# Patient Record
Sex: Male | Born: 1945
Health system: Southern US, Community
[De-identification: ages and names within clinical notes are randomized; demographics above are authoritative.]

## PROBLEM LIST (undated history)

## (undated) DIAGNOSIS — I10 Essential (primary) hypertension: Secondary | ICD-10-CM

## (undated) DIAGNOSIS — E119 Type 2 diabetes mellitus without complications: Secondary | ICD-10-CM

## (undated) DIAGNOSIS — J449 Chronic obstructive pulmonary disease, unspecified: Secondary | ICD-10-CM

## (undated) HISTORY — PX: EYE SURGERY: SHX253

## (undated) HISTORY — PX: HERNIA REPAIR: SHX51

## (undated) HISTORY — PX: KNEE CAPSULOTOMY: SHX994

## (undated) HISTORY — PX: APPENDECTOMY: SHX54

---

## 2017-01-10 ENCOUNTER — Ambulatory Visit (INDEPENDENT_AMBULATORY_CARE_PROVIDER_SITE_OTHER): Payer: PRIVATE HEALTH INSURANCE

## 2017-01-10 ENCOUNTER — Ambulatory Visit
Admission: EM | Admit: 2017-01-10 | Discharge: 2017-01-10 | Disposition: A | Discharge status: Home / Self Care | Payer: PRIVATE HEALTH INSURANCE | Attending: Family Medicine | Admitting: Family Medicine

## 2017-01-10 ENCOUNTER — Encounter: Payer: Self-pay | Admitting: *Deleted

## 2017-01-10 DIAGNOSIS — J441 Chronic obstructive pulmonary disease with (acute) exacerbation: Secondary | ICD-10-CM

## 2017-01-10 DIAGNOSIS — R05 Cough: Secondary | ICD-10-CM

## 2017-01-10 DIAGNOSIS — R059 Cough, unspecified: Secondary | ICD-10-CM

## 2017-01-10 DIAGNOSIS — J209 Acute bronchitis, unspecified: Secondary | ICD-10-CM

## 2017-01-10 DIAGNOSIS — J069 Acute upper respiratory infection, unspecified: Secondary | ICD-10-CM | POA: Diagnosis not present

## 2017-01-10 HISTORY — DX: Type 2 diabetes mellitus without complications: E11.9

## 2017-01-10 HISTORY — DX: Chronic obstructive pulmonary disease, unspecified: J44.9

## 2017-01-10 MED ORDER — IPRATROPIUM-ALBUTEROL 0.5-2.5 (3) MG/3ML IN SOLN
3.0000 mL | Freq: Once | RESPIRATORY_TRACT | Status: AC
  Administered 2017-01-10: 3 mL via RESPIRATORY_TRACT

## 2017-01-10 MED ORDER — METHYLPREDNISOLONE SODIUM SUCC 125 MG IJ SOLR
125.0000 mg | Freq: Once | INTRAMUSCULAR | Status: AC
  Administered 2017-01-10: 125 mg via INTRAMUSCULAR

## 2017-01-10 MED ORDER — AZITHROMYCIN 500 MG PO TABS: ORAL_TABLET | ORAL | 0 refills | Status: DC

## 2017-01-10 MED ORDER — FEXOFENADINE-PSEUDOEPHED ER 180-240 MG PO TB24: 1.0000 | ORAL_TABLET | Freq: Every day | ORAL | 0 refills | Status: DC

## 2017-01-10 MED ORDER — ALBUTEROL SULFATE HFA 108 (90 BASE) MCG/ACT IN AERS: 2.0000 | INHALATION_SPRAY | RESPIRATORY_TRACT | 0 refills | Status: DC | PRN

## 2017-01-10 MED ORDER — HYDROCOD POLST-CPM POLST ER 10-8 MG/5ML PO SUER: 5.0000 mL | Freq: Two times a day (BID) | ORAL | 0 refills | Status: DC | PRN

## 2017-01-10 MED ORDER — PREDNISONE 10 MG (21) PO TBPK: ORAL_TABLET | ORAL | 0 refills | Status: DC

## 2017-01-10 NOTE — ED Triage Notes (Signed)
Productive cough- yellow, runny nose, fever (103) this am, head and chest congestion, chills, body aches, x2 days.

## 2017-01-10 NOTE — ED Provider Notes (Signed)
[facility]    CSN: [id number] Arrival date & time: 01/10/17  1515     History   Chief Complaint Chief Complaint  Patient presents with  . Cough  . Nasal Congestion  . Fever  . Generalized Body Aches  . Chills  . Sore Throat    HPI Darren Sharp is a 71 y.o. male.   Patient is a 71 year old white male who has had a history of COPD in the past. He stopped smoking about 6 years ago for the [place] he was told his lungs doing much better and 8 with not going to call him having COPD. Apparently is not really had any trouble with his breathing until recently in the last month he's had numerous episodes of coughing and chest congestion and some shortness of breath. They thought that this was just a cold and was premarked weight and things chronically get better. However recently asked to 3 days to coughing has gotten worse. He's had more shortness of breath he'swheezing as well. His wife states that he was actually doing little bit better yesterday he went out riding on his motorcycle and came back in much worse short of breath coughing and congested. He does have trouble with allergies seasonal allergies. He does have history of diabetes and he's had a hernia repair performed the past. No pertinent family medical history relevant to today's visit.   The history is provided by the patient and the spouse.  Cough  Cough characteristics:  Productive and non-productive Sputum characteristics:  Yellow and green Severity:  Moderate Onset quality:  Sudden Timing:  Constant Progression:  Worsening Chronicity:  New Smoker: no   Context: upper respiratory infection, weather changes and with activity   Relieved by:  Nothing Worsened by:  Deep breathing Ineffective treatments:  None tried Associated symptoms: fever, shortness of breath and sinus congestion   Associated symptoms: no chest pain, no myalgias, no rash, no sore throat and no weight loss   Fever  Associated symptoms:  cough   Associated symptoms: no chest pain, no myalgias, no rash and no sore throat   Sore Throat  Associated symptoms include shortness of breath. Pertinent negatives include no chest pain.    Past Medical History:  Diagnosis Date  . COPD (chronic obstructive pulmonary disease) ([place])   . Diabetes mellitus without complication ([place])     There are no active problems to display for this patient.   Past Surgical History:  Procedure Laterality Date  . HERNIA REPAIR         Home Medications    Prior to Admission medications   Medication Sig Start Date End Date Taking? Authorizing Provider  atorvastatin (LIPITOR) 40 MG tablet Take 40 mg by mouth daily.   Yes Historical Provider, MD  lisinopril (PRINIVIL,ZESTRIL) 20 MG tablet Take 20 mg by mouth daily.   Yes Historical Provider, MD  albuterol (PROVENTIL HFA;VENTOLIN HFA) 108 (90 Base) MCG/ACT inhaler Inhale 2 puffs into the lungs every 4 (four) hours as needed for wheezing or shortness of breath. 01/10/17   Frederich Cha, MD  azithromycin (ZITHROMAX) 500 MG tablet 1 tablet daily for 5 days 01/10/17   Frederich Cha, MD  chlorpheniramine-HYDROcodone [facility] PENNKINETIC ER) 10-8 MG/5ML SUER Take 5 mLs by mouth every 12 (twelve) hours as needed for cough. 01/10/17   Frederich Cha, MD  fexofenadine-pseudoephedrine (ALLEGRA-D ALLERGY & CONGESTION) 180-240 MG 24 hr tablet Take 1 tablet by mouth daily. 01/10/17   Frederich Cha, MD  predniSONE [facility]  UNI-PAK 21 TAB) 10 MG (21) TBPK tablet Sig 6 tablet day 1, 5 tablets day 2, 4 tablets day 3,,3tablets day 4, 2 tablets day 5, 1 tablet day 6 take all tablets orally 01/10/17   Frederich Cha, MD    Family History History reviewed. No pertinent family history.  Social History Social History  Substance Use Topics  . Smoking status: Former Research scientist (life sciences)  . Smokeless tobacco: Never Used  . Alcohol use Yes     Allergies   Penicillins   Review of Systems Review of Systems  Constitutional: Positive for  fatigue and fever. Negative for weight loss.  HENT: Negative for sore throat.   Respiratory: Positive for cough and shortness of breath.   Cardiovascular: Negative for chest pain.  Musculoskeletal: Negative for myalgias.  Skin: Negative for rash.  All other systems reviewed and are negative.    Physical Exam Triage Vital Signs ED Triage Vitals  Enc Vitals Group     BP 01/10/17 1536 132/69     Pulse Rate 01/10/17 1536 86     Resp 01/10/17 1536 16     Temp 01/10/17 1536 99.3 F (37.4 C)     Temp Source 01/10/17 1536 Oral     SpO2 01/10/17 1536 97 %     Weight 01/10/17 1538 150 lb (68 kg)     Height 01/10/17 1538 5\' 8"  (1.727 m)     Head Circumference --      Peak Flow --      Pain Score --      Pain Loc --      Pain Edu? --      Excl. in [place]? --    No data found.   Updated Vital Signs BP 132/69 (BP Location: Left Arm)   Pulse 86   Temp 99.3 F (37.4 C) (Oral)   Resp 16   Ht 5\' 8"  (1.727 m)   Wt 150 lb (68 kg)   SpO2 97%   BMI 22.81 kg/m   Visual Acuity Right Eye Distance:   Left Eye Distance:   Bilateral Distance:    Right Eye Near:   Left Eye Near:    Bilateral Near:     Physical Exam  Constitutional: He is oriented to person, place, and time. He appears well-developed and well-nourished.  HENT:  Head: Normocephalic and atraumatic.  Right Ear: External ear normal.  Left Ear: External ear normal.  Mouth/Throat: Oropharynx is clear and moist.  Eyes: Conjunctivae and EOM are normal. Pupils are equal, round, and reactive to light.  Neck: Normal range of motion. Neck supple.  Cardiovascular: Normal rate, regular rhythm and normal heart sounds.   Pulmonary/Chest: He has decreased breath sounds. He has wheezes.  Musculoskeletal: Normal range of motion. He exhibits no edema or deformity.  Lymphadenopathy:    He has cervical adenopathy.  Neurological: He is alert and oriented to person, place, and time.  Skin: Skin is warm.  Psychiatric: He has a normal mood  and affect.  Vitals reviewed.    UC Treatments / Results  Labs (all labs ordered are listed, but only abnormal results are displayed) Labs Reviewed - No data to display  EKG  EKG Interpretation None       Radiology Dg Chest 2 View  Result Date: 01/10/2017 CLINICAL DATA:  Cough for 4 weeks, some shortness of breath and body aches EXAM: CHEST  2 VIEW COMPARISON:  None. FINDINGS: No pneumonia or effusion is seen. The lungs are slightly hyperaerated. Probable linear  scarring is noted in the right mid lung. Mediastinal and hilar contours are unremarkable. There is some peribronchial thickening which may indicate bronchitis. The heart is within normal limits in size. No bony abnormality is seen. There is evidence of thoracoabdominal aortic atherosclerosis present. IMPRESSION: 1. Hyper aeration.  No pneumonia. 2. Question bronchitis. 3. Thoracolumbar aortic atherosclerosis. Electronically Signed   By: Ivar Drape M.D.   On: 01/10/2017 17:10    Procedures Procedures (including critical care time)  Medications Ordered in UC Medications  methylPREDNISolone sodium succinate (SOLU-MEDROL) 125 mg/2 mL injection 125 mg (125 mg Intramuscular Given 01/10/17 1646)  ipratropium-albuterol (DUONEB) 0.5-2.5 (3) MG/3ML nebulizer solution 3 mL (3 mLs Nebulization Given 01/10/17 1648)     Initial Impression / Assessment and Plan / UC Course  I have reviewed the triage vital signs and the nursing notes.  Pertinent labs & imaging results that were available during my care of the patient were reviewed by me and considered in my medical decision making (see chart for details).     Several different things going on in one continuous cough 3-4 weeks will make sure he does not have pneumonia we'll get a chest x-ray. He's had a history of COPD and while he's not had really any exacerbations in years since he stopped smoking distal carcinoma stammers and ventricles lungs explained to him and his wife this is  made he was seeing now is gotten older. The other question is whether some of this is seasonal allergies prednisone will help with his COPD or seasonal allergies. We will going to give him a breathing treatment here now see if it helps with DuoNeb and also going to give him 125 mg of Solu-Medrol IM. We'll place him on a 6 day course penicillin taper Tussionex 1 teaspoon twice a day Allegra-D for the current nasal congestion and the antibiotic will be there is Zithromax 500 for 5 days if chest x-ray is negative on Levaquin 500 mg for 10 days if chest x-ray is positive. Follow-up PCP is needed and we'll give no for today and tomorrow as well.      Final Clinical Impressions(s) / UC Diagnoses   Final diagnoses:  Upper respiratory tract infection, unspecified type  Cough  Acute bronchitis with bronchospasm  COPD exacerbation (HCC)    New Prescriptions New Prescriptions   ALBUTEROL (PROVENTIL HFA;VENTOLIN HFA) 108 (90 BASE) MCG/ACT INHALER    Inhale 2 puffs into the lungs every 4 (four) hours as needed for wheezing or shortness of breath.   AZITHROMYCIN (ZITHROMAX) 500 MG TABLET    1 tablet daily for 5 days   CHLORPHENIRAMINE-HYDROCODONE (TUSSIONEX PENNKINETIC ER) 10-8 MG/5ML SUER    Take 5 mLs by mouth every 12 (twelve) hours as needed for cough.   FEXOFENADINE-PSEUDOEPHEDRINE (ALLEGRA-D ALLERGY & CONGESTION) 180-240 MG 24 HR TABLET    Take 1 tablet by mouth daily.   PREDNISONE (STERAPRED UNI-PAK 21 TAB) 10 MG (21) TBPK TABLET    Sig 6 tablet day 1, 5 tablets day 2, 4 tablets day 3,,3tablets day 4, 2 tablets day 5, 1 tablet day 6 take all tablets orally    Note: This dictation was prepared with Dragon dictation along with smaller phrase technology. Any transcriptional errors that result from this process are unintentional.   Frederich Cha, MD 01/10/17 1730

## 2017-01-21 ENCOUNTER — Ambulatory Visit: Payer: Self-pay | Admitting: Family Medicine

## 2017-01-28 ENCOUNTER — Ambulatory Visit (INDEPENDENT_AMBULATORY_CARE_PROVIDER_SITE_OTHER): Payer: PPO | Admitting: Family Medicine

## 2017-01-28 ENCOUNTER — Encounter: Payer: Self-pay | Admitting: Family Medicine

## 2017-01-28 VITALS — BP 138/82 | HR 79 | Resp 16 | Ht 68.0 in | Wt 158.0 lb

## 2017-01-28 DIAGNOSIS — I700 Atherosclerosis of aorta: Secondary | ICD-10-CM

## 2017-01-28 DIAGNOSIS — Z8709 Personal history of other diseases of the respiratory system: Secondary | ICD-10-CM

## 2017-01-28 DIAGNOSIS — E785 Hyperlipidemia, unspecified: Secondary | ICD-10-CM | POA: Insufficient documentation

## 2017-01-28 DIAGNOSIS — J40 Bronchitis, not specified as acute or chronic: Secondary | ICD-10-CM | POA: Diagnosis not present

## 2017-01-28 DIAGNOSIS — Z8669 Personal history of other diseases of the nervous system and sense organs: Secondary | ICD-10-CM | POA: Insufficient documentation

## 2017-01-28 DIAGNOSIS — I10 Essential (primary) hypertension: Secondary | ICD-10-CM | POA: Insufficient documentation

## 2017-01-28 DIAGNOSIS — Z8639 Personal history of other endocrine, nutritional and metabolic disease: Secondary | ICD-10-CM

## 2017-01-28 MED ORDER — LISINOPRIL 20 MG PO TABS: 20.0000 mg | ORAL_TABLET | Freq: Every day | ORAL | 3 refills | Status: DC

## 2017-01-30 NOTE — Progress Notes (Signed)
Date:  01/28/2017   Name:  Darren Sharp   DOB:  28-May-1946   MRN:  [id number]  PCP:  Adline Potter, MD    Chief Complaint: Establish Care   History of Present Illness:  This is a 71 y.o. male seen for initial visit. Seen [facility] 3 wks ago for bronchitis, placed on azith and steroids, improved but still has cough prod yellow/green phlegm and rhinorrhea, albuterol helps cough. CXR showed aortic atherosclerosis. Hx "stage 3" COPD which resolved when stopped smoking., does not usually wheeze or cough. HTN on lisinopril for yrs with good control. HLD on Lipitor x 3-4 yrs, last lipids 6 months ago. Takes ibuprofen prn for intermittent neck pain about once weekly. Hx T2DM in past but resolved with weight loss, not checking sugars currently. Nocturia x 1 stable. Hx B detached retina, sees optho regularly. Father died mesothelioma, mother died pulmonary fibrosis. Tet imm 3 yrs ago, Prevnar last month, declines zoster imm.  Review of Systems:  Review of Systems  Constitutional: Negative for chills and fever.  HENT: Negative for ear pain and sore throat.   Eyes: Negative for pain.  Respiratory: Negative for shortness of breath.   Cardiovascular: Negative for chest pain and leg swelling.  Gastrointestinal: Negative for abdominal pain.  Endocrine: Negative for polydipsia and polyuria.  Musculoskeletal: Negative for myalgias.  Neurological: Negative for syncope and light-headedness.    Patient Active Problem List   Diagnosis Date Noted  . Hypertension 01/28/2017  . Hyperlipidemia 01/28/2017  . Bronchitis 01/28/2017  . History of COPD 01/28/2017  . Hx of retinal detachment 01/28/2017  . Hx of diabetes mellitus 01/28/2017    Prior to Admission medications   Medication Sig Start Date End Date Taking? Authorizing Provider  albuterol (PROVENTIL HFA;VENTOLIN HFA) 108 (90 Base) MCG/ACT inhaler Inhale 2 puffs into the lungs every 4 (four) hours as needed for wheezing or shortness of breath. 01/10/17  Yes  Frederich Cha, MD  atorvastatin (LIPITOR) 40 MG tablet Take 40 mg by mouth daily.   Yes Historical Provider, MD  lisinopril (PRINIVIL,ZESTRIL) 20 MG tablet Take 1 tablet (20 mg total) by mouth daily. 01/28/17  Yes Adline Potter, MD    Allergies  Allergen Reactions  . Penicillins Swelling    Past Surgical History:  Procedure Laterality Date  . APPENDECTOMY    . EYE SURGERY    . HERNIA REPAIR    . KNEE CAPSULOTOMY Right     Social History  Substance Use Topics  . Smoking status: Former Smoker    Types: Cigarettes    Quit date: 01/28/2010  . Smokeless tobacco: Never Used  . Alcohol use Yes    Family History  Problem Relation Age of Onset  . Cancer Mother     lung  . Cancer Father     lung cancer     Medication list has been reviewed and updated.  Physical Examination: BP 138/82   Pulse 79   Resp 16   Ht 5\' 8"  (1.727 m)   Wt 158 lb (71.7 kg)   SpO2 99%   BMI 24.02 kg/m   Physical Exam  Constitutional: He is oriented to person, place, and time. He appears well-developed and well-nourished.  HENT:  Head: Normocephalic and atraumatic.  Right Ear: External ear normal.  Left Ear: External ear normal.  Nose: Nose normal.  Mouth/Throat: Oropharynx is clear and moist.  TMs clear  Eyes: Conjunctivae and EOM are normal. Pupils are equal, round, and reactive to light. No scleral  icterus.  Neck: Neck supple. No thyromegaly present.  Cardiovascular: Normal rate, regular rhythm, normal heart sounds and intact distal pulses.   Pulmonary/Chest: Effort normal and breath sounds normal. He has no wheezes. He has no rales.  Abdominal: Soft. He exhibits no distension and no mass. There is no tenderness.  Musculoskeletal: He exhibits no edema.  Lymphadenopathy:    He has no cervical adenopathy.  Neurological: He is alert and oriented to person, place, and time. Coordination normal.  Romberg negative, gait normal  Skin: Skin is warm and dry.  Psychiatric: He has a normal mood and  affect. His behavior is normal.  Nursing note and vitals reviewed.   Assessment and Plan:  1. Bronchitis Resolving, cont albuterol MDI prn, consider pulm referral if sxs persist given hx COPD  2. Aortic atherosclerosis ([place]) On CXR, asymptomatic, cont statin, consider asa  3. Essential hypertension Marginal control on lisinopril, recheck in one month - Comprehensive Metabolic Panel (CMET) - CBC  4. Hyperlipidemia, unspecified hyperlipidemia type Unclear control on Lipitor - Lipid Profile  5. History of COPD  6. Hx of retinal detachment  7. Hx of diabetes mellitus - HgB A1c  8. HM Will need Pneumovax in one year  Return in about 4 weeks (around 02/25/2017).   45 minutes spent with pt over half in counseling  Tabytha Gradillas M. [facility]  01/30/2017

## 2017-02-04 DIAGNOSIS — Z8639 Personal history of other endocrine, nutritional and metabolic disease: Secondary | ICD-10-CM | POA: Diagnosis not present

## 2017-02-04 DIAGNOSIS — E785 Hyperlipidemia, unspecified: Secondary | ICD-10-CM | POA: Diagnosis not present

## 2017-02-04 DIAGNOSIS — I10 Essential (primary) hypertension: Secondary | ICD-10-CM | POA: Diagnosis not present

## 2017-02-05 ENCOUNTER — Other Ambulatory Visit: Payer: Self-pay | Admitting: Family Medicine

## 2017-02-05 LAB — COMPREHENSIVE METABOLIC PANEL
ALT: 19 IU/L (ref 0–44)
AST: 16 IU/L (ref 0–40)
Albumin/Globulin Ratio: 1.9 (ref 1.2–2.2)
Albumin: 4.2 g/dL (ref 3.6–4.8)
Alkaline Phosphatase: 55 IU/L (ref 39–117)
BUN/Creatinine Ratio: 22 (ref 10–24)
BUN: 22 mg/dL (ref 8–27)
Bilirubin Total: 0.5 mg/dL (ref 0.0–1.2)
CO2: 22 mmol/L (ref 18–29)
Calcium: 9.1 mg/dL (ref 8.6–10.2)
Chloride: 103 mmol/L (ref 96–106)
Creatinine, Ser: 0.98 mg/dL (ref 0.76–1.27)
GFR calc Af Amer: 91 mL/min/{1.73_m2} (ref >59)
GFR calc non Af Amer: 78 mL/min/{1.73_m2} (ref >59)
Globulin, Total: 2.2 g/dL (ref 1.5–4.5)
Glucose: 132 mg/dL — ABNORMAL HIGH (ref 65–99)
Potassium: 4.6 mmol/L (ref 3.5–5.2)
Sodium: 143 mmol/L (ref 134–144)
Total Protein: 6.4 g/dL (ref 6.0–8.5)

## 2017-02-05 LAB — CBC
Hematocrit: 38.9 % (ref 37.5–51.0)
Hemoglobin: 13.1 g/dL (ref 13.0–17.7)
MCH: 31 pg (ref 26.6–33.0)
MCHC: 33.7 g/dL (ref 31.5–35.7)
MCV: 92 fL (ref 79–97)
Platelets: 253 10*3/uL (ref 150–379)
RBC: 4.22 x10E6/uL (ref 4.14–5.80)
RDW: 13.5 % (ref 12.3–15.4)
WBC: 5.7 10*3/uL (ref 3.4–10.8)

## 2017-02-05 LAB — LIPID PANEL
Chol/HDL Ratio: 3.2 ratio units (ref 0.0–5.0)
Cholesterol, Total: 151 mg/dL (ref 100–199)
HDL: 47 mg/dL (ref >39)
LDL Calculated: 91 mg/dL (ref 0–99)
Triglycerides: 64 mg/dL (ref 0–149)
VLDL Cholesterol Cal: 13 mg/dL (ref 5–40)

## 2017-02-05 LAB — HEMOGLOBIN A1C
Est. average glucose Bld gHb Est-mCnc: 160 mg/dL
Hgb A1c MFr Bld: 7.2 % — ABNORMAL HIGH (ref 4.8–5.6)

## 2017-02-05 MED ORDER — ASPIRIN 81 MG PO TABS: 81.0000 mg | ORAL_TABLET | Freq: Every day | ORAL | Status: AC

## 2017-02-05 NOTE — Progress Notes (Signed)
a 

## 2017-03-04 ENCOUNTER — Ambulatory Visit: Payer: PPO | Admitting: Family Medicine

## 2017-03-15 DIAGNOSIS — E119 Type 2 diabetes mellitus without complications: Secondary | ICD-10-CM | POA: Diagnosis not present

## 2017-09-20 DIAGNOSIS — R0602 Shortness of breath: Secondary | ICD-10-CM | POA: Diagnosis not present

## 2017-09-20 DIAGNOSIS — R05 Cough: Secondary | ICD-10-CM | POA: Diagnosis not present

## 2017-09-26 ENCOUNTER — Telehealth: Payer: Self-pay | Admitting: Family Medicine

## 2017-09-26 ENCOUNTER — Ambulatory Visit: Payer: PPO | Admitting: Family Medicine

## 2017-09-26 ENCOUNTER — Encounter: Payer: Self-pay | Admitting: Family Medicine

## 2017-09-26 VITALS — BP 138/70 | HR 80 | Ht 68.0 in | Wt 168.0 lb

## 2017-09-26 DIAGNOSIS — Z8709 Personal history of other diseases of the respiratory system: Secondary | ICD-10-CM | POA: Diagnosis not present

## 2017-09-26 DIAGNOSIS — J40 Bronchitis, not specified as acute or chronic: Secondary | ICD-10-CM | POA: Diagnosis not present

## 2017-09-26 MED ORDER — GUAIFENESIN-CODEINE 100-10 MG/5ML PO SYRP: 5.0000 mL | ORAL_SOLUTION | Freq: Three times a day (TID) | ORAL | 0 refills | Status: DC | PRN

## 2017-09-26 MED ORDER — ALBUTEROL SULFATE HFA 108 (90 BASE) MCG/ACT IN AERS: 2.0000 | INHALATION_SPRAY | RESPIRATORY_TRACT | 0 refills | Status: DC | PRN

## 2017-09-26 MED ORDER — LEVOFLOXACIN 500 MG PO TABS: 500.0000 mg | ORAL_TABLET | Freq: Every day | ORAL | 0 refills | Status: DC

## 2017-09-26 NOTE — Telephone Encounter (Signed)
Pts wife calling about pt not feeling better after being seen at an urgent care in [place] while they were out, a lot of coughing and secretions. Told pt you will call her.

## 2017-09-26 NOTE — Telephone Encounter (Signed)
Called Baxter Flattery and she said it is ok to have them see Darren Sharp today. They have notes from [place] and Xray results. Advised to ONLY deal with cough today. Coming in at 1:30 PM. Thanks

## 2017-09-26 NOTE — Progress Notes (Signed)
Name: Darren Sharp   MRN: [id number]    DOB: 20-May-1946   Date:09/26/2017       Progress Note  Subjective  Chief Complaint  Chief Complaint  Patient presents with  . Follow-up    was in [place]- woke up in the middle of the night with bronchial spasms, thick mucus and "terrible cough". Returned home x 2 days ago. Started with the cough and yellow production again last night. Along with nasal drainage.    Cough  This is a new problem. The current episode started in the past 7 days. The problem has been waxing and waning. The cough is productive of purulent sputum. Associated symptoms include wheezing. Pertinent negatives include no chest pain, chills, ear congestion, ear pain, fever, headaches, heartburn, hemoptysis, myalgias, nasal congestion, postnasal drip, rash, rhinorrhea, sore throat, shortness of breath, sweats or weight loss. He has tried a beta-agonist inhaler and prescription cough suppressant for the symptoms. The treatment provided moderate relief. His past medical history is significant for COPD and emphysema. There is no history of asthma, bronchiectasis, bronchitis, environmental allergies or pneumonia.    No problem-specific Assessment & Plan notes found for this encounter.   Past Medical History:  Diagnosis Date  . COPD (chronic obstructive pulmonary disease) ([place])   . COPD (chronic obstructive pulmonary disease) ([place])   . Diabetes mellitus without complication [facility])     Past Surgical History:  Procedure Laterality Date  . APPENDECTOMY    . EYE SURGERY    . HERNIA REPAIR    . KNEE CAPSULOTOMY Right     Family History  Problem Relation Age of Onset  . Cancer Mother        lung  . Cancer Father        lung cancer     Social History   Socioeconomic History  . Marital status: Married    Spouse name: Not on file  . Number of children: Not on file  . Years of education: Not on file  . Highest education level: Not on file  Social Needs  . Financial resource  strain: Not on file  . Food insecurity - worry: Not on file  . Food insecurity - inability: Not on file  . Transportation needs - medical: Not on file  . Transportation needs - non-medical: Not on file  Occupational History  . Not on file  Tobacco Use  . Smoking status: Former Smoker    Types: Cigarettes    Last attempt to quit: 01/28/2010    Years since quitting: 7.6  . Smokeless tobacco: Never Used  Substance and Sexual Activity  . Alcohol use: Yes  . Drug use: No  . Sexual activity: Not on file  Other Topics Concern  . Not on file  Social History Narrative  . Not on file    Allergies  Allergen Reactions  . Penicillins Swelling    Outpatient Medications Prior to Visit  Medication Sig Dispense Refill  . aspirin 81 MG tablet Take 1 tablet (81 mg total) by mouth daily.    [name] atorvastatin (LIPITOR) 40 MG tablet Take 40 mg by mouth daily.    [name] lisinopril (PRINIVIL,ZESTRIL) 20 MG tablet Take 1 tablet (20 mg total) by mouth daily. 90 tablet 3  . albuterol (PROVENTIL HFA;VENTOLIN HFA) 108 (90 Base) MCG/ACT inhaler Inhale 2 puffs into the lungs every 4 (four) hours as needed for wheezing or shortness of breath. 1 Inhaler 0   No facility-administered medications prior to visit.  Review of Systems  Constitutional: Negative for chills, fever, malaise/fatigue and weight loss.  HENT: Negative for ear discharge, ear pain, postnasal drip, rhinorrhea and sore throat.   Eyes: Negative for blurred vision.  Respiratory: Positive for cough and wheezing. Negative for hemoptysis, sputum production and shortness of breath.   Cardiovascular: Negative for chest pain, palpitations and leg swelling.  Gastrointestinal: Negative for abdominal pain, blood in stool, constipation, diarrhea, heartburn, melena and nausea.  Genitourinary: Negative for dysuria, frequency, hematuria and urgency.  Musculoskeletal: Negative for back pain, joint pain, myalgias and neck pain.  Skin: Negative for rash.   Neurological: Negative for dizziness, tingling, sensory change, focal weakness and headaches.  Endo/Heme/Allergies: Negative for environmental allergies and polydipsia. Does not bruise/bleed easily.  Psychiatric/Behavioral: Negative for depression and suicidal ideas. The patient is not nervous/anxious and does not have insomnia.      Objective  Vitals:   09/26/17 1335  BP: 138/70  Pulse: 80  SpO2: 97%  Weight: 168 lb (76.2 kg)  Height: 5\' 8"  (1.727 m)    Physical Exam  Constitutional: He is oriented to person, place, and time and well-developed, well-nourished, and in no distress.  HENT:  Head: Normocephalic.  Right Ear: External ear normal.  Left Ear: External ear normal.  Nose: Nose normal.  Mouth/Throat: Oropharynx is clear and moist.  Eyes: Conjunctivae and EOM are normal. Pupils are equal, round, and reactive to light. Right eye exhibits no discharge. Left eye exhibits no discharge. No scleral icterus.  Neck: Normal range of motion. Neck supple. No JVD present. No tracheal deviation present. No thyromegaly present.  Cardiovascular: Normal rate, regular rhythm, normal heart sounds and intact distal pulses. Exam reveals no gallop and no friction rub.  No murmur heard. Pulmonary/Chest: Breath sounds normal. No respiratory distress. He has no wheezes. He has no rales.  Abdominal: Soft. Bowel sounds are normal. He exhibits no mass. There is no hepatosplenomegaly. There is no tenderness. There is no rebound, no guarding and no CVA tenderness.  Musculoskeletal: Normal range of motion. He exhibits no edema or tenderness.  Lymphadenopathy:    He has no cervical adenopathy.  Neurological: He is alert and oriented to person, place, and time. He has normal sensation, normal strength, normal reflexes and intact cranial nerves. No cranial nerve deficit.  Skin: Skin is warm. No rash noted.  Psychiatric: Mood and affect normal.  Nursing note and vitals reviewed.     Assessment &  Plan  Problem List Items Addressed This Visit      Respiratory   Bronchitis - Primary   Relevant Medications   levofloxacin (LEVAQUIN) 500 MG tablet   guaiFENesin-codeine (ROBITUSSIN AC) 100-10 MG/5ML syrup   albuterol (PROVENTIL HFA;VENTOLIN HFA) 108 (90 Base) MCG/ACT inhaler     Other   History of COPD      Meds ordered this encounter  Medications  . levofloxacin (LEVAQUIN) 500 MG tablet    Sig: Take 1 tablet (500 mg total) daily by mouth.    Dispense:  7 tablet    Refill:  0  . guaiFENesin-codeine (ROBITUSSIN AC) 100-10 MG/5ML syrup    Sig: Take 5 mLs 3 (three) times daily as needed by mouth for cough.    Dispense:  100 mL    Refill:  0  . albuterol (PROVENTIL HFA;VENTOLIN HFA) 108 (90 Base) MCG/ACT inhaler    Sig: Inhale 2 puffs every 4 (four) hours as needed into the lungs for wheezing or shortness of breath.    Dispense:  1  Inhaler    Refill:  0      Dr. Deanna Jones [facility]  09/26/17

## 2017-10-18 DIAGNOSIS — I639 Cerebral infarction, unspecified: Secondary | ICD-10-CM | POA: Diagnosis not present

## 2017-10-18 DIAGNOSIS — E119 Type 2 diabetes mellitus without complications: Secondary | ICD-10-CM | POA: Diagnosis not present

## 2017-10-18 DIAGNOSIS — H3582 Retinal ischemia: Secondary | ICD-10-CM | POA: Diagnosis not present

## 2017-10-18 DIAGNOSIS — H35033 Hypertensive retinopathy, bilateral: Secondary | ICD-10-CM | POA: Diagnosis not present

## 2017-10-18 DIAGNOSIS — E785 Hyperlipidemia, unspecified: Secondary | ICD-10-CM | POA: Diagnosis not present

## 2017-10-18 DIAGNOSIS — Z8669 Personal history of other diseases of the nervous system and sense organs: Secondary | ICD-10-CM | POA: Diagnosis not present

## 2017-10-18 DIAGNOSIS — Z9889 Other specified postprocedural states: Secondary | ICD-10-CM | POA: Diagnosis not present

## 2017-10-18 DIAGNOSIS — H547 Unspecified visual loss: Secondary | ICD-10-CM | POA: Diagnosis not present

## 2017-10-18 DIAGNOSIS — H538 Other visual disturbances: Secondary | ICD-10-CM | POA: Diagnosis not present

## 2017-10-18 DIAGNOSIS — Z79899 Other long term (current) drug therapy: Secondary | ICD-10-CM | POA: Diagnosis not present

## 2017-10-18 DIAGNOSIS — H539 Unspecified visual disturbance: Secondary | ICD-10-CM | POA: Diagnosis not present

## 2017-10-18 DIAGNOSIS — I498 Other specified cardiac arrhythmias: Secondary | ICD-10-CM | POA: Diagnosis not present

## 2017-10-18 DIAGNOSIS — H35373 Puckering of macula, bilateral: Secondary | ICD-10-CM | POA: Diagnosis not present

## 2017-10-18 DIAGNOSIS — J449 Chronic obstructive pulmonary disease, unspecified: Secondary | ICD-10-CM | POA: Diagnosis not present

## 2017-10-18 DIAGNOSIS — G453 Amaurosis fugax: Secondary | ICD-10-CM | POA: Diagnosis not present

## 2017-10-18 DIAGNOSIS — I10 Essential (primary) hypertension: Secondary | ICD-10-CM | POA: Diagnosis not present

## 2017-10-18 DIAGNOSIS — G43809 Other migraine, not intractable, without status migrainosus: Secondary | ICD-10-CM | POA: Diagnosis not present

## 2017-11-18 DIAGNOSIS — Z1329 Encounter for screening for other suspected endocrine disorder: Secondary | ICD-10-CM | POA: Diagnosis not present

## 2017-11-18 DIAGNOSIS — E118 Type 2 diabetes mellitus with unspecified complications: Secondary | ICD-10-CM | POA: Diagnosis not present

## 2017-11-18 DIAGNOSIS — E785 Hyperlipidemia, unspecified: Secondary | ICD-10-CM | POA: Diagnosis not present

## 2017-11-18 DIAGNOSIS — Z125 Encounter for screening for malignant neoplasm of prostate: Secondary | ICD-10-CM | POA: Diagnosis not present

## 2017-11-18 DIAGNOSIS — Z1211 Encounter for screening for malignant neoplasm of colon: Secondary | ICD-10-CM | POA: Diagnosis not present

## 2017-11-18 DIAGNOSIS — I10 Essential (primary) hypertension: Secondary | ICD-10-CM | POA: Diagnosis not present

## 2017-11-25 DIAGNOSIS — H35033 Hypertensive retinopathy, bilateral: Secondary | ICD-10-CM | POA: Diagnosis not present

## 2017-11-25 DIAGNOSIS — H3582 Retinal ischemia: Secondary | ICD-10-CM | POA: Diagnosis not present

## 2017-11-25 DIAGNOSIS — H35373 Puckering of macula, bilateral: Secondary | ICD-10-CM | POA: Diagnosis not present

## 2017-11-25 DIAGNOSIS — G453 Amaurosis fugax: Secondary | ICD-10-CM | POA: Diagnosis not present

## 2018-02-10 DIAGNOSIS — E118 Type 2 diabetes mellitus with unspecified complications: Secondary | ICD-10-CM | POA: Diagnosis not present

## 2018-02-17 DIAGNOSIS — J438 Other emphysema: Secondary | ICD-10-CM | POA: Diagnosis not present

## 2018-02-17 DIAGNOSIS — I10 Essential (primary) hypertension: Secondary | ICD-10-CM | POA: Diagnosis not present

## 2018-02-17 DIAGNOSIS — E785 Hyperlipidemia, unspecified: Secondary | ICD-10-CM | POA: Diagnosis not present

## 2018-02-17 DIAGNOSIS — E118 Type 2 diabetes mellitus with unspecified complications: Secondary | ICD-10-CM | POA: Diagnosis not present

## 2018-02-17 DIAGNOSIS — Z0000 Encounter for general adult medical examination without abnormal findings: Secondary | ICD-10-CM | POA: Diagnosis not present

## 2018-03-03 DIAGNOSIS — Z1211 Encounter for screening for malignant neoplasm of colon: Secondary | ICD-10-CM | POA: Diagnosis not present

## 2018-03-03 DIAGNOSIS — E119 Type 2 diabetes mellitus without complications: Secondary | ICD-10-CM | POA: Diagnosis not present

## 2018-03-31 ENCOUNTER — Other Ambulatory Visit: Payer: Self-pay | Admitting: Family Medicine

## 2018-03-31 DIAGNOSIS — J40 Bronchitis, not specified as acute or chronic: Secondary | ICD-10-CM

## 2018-04-24 ENCOUNTER — Other Ambulatory Visit: Payer: Self-pay | Admitting: Family Medicine

## 2018-04-24 ENCOUNTER — Other Ambulatory Visit: Payer: Self-pay | Admitting: Internal Medicine

## 2018-04-29 DIAGNOSIS — R1314 Dysphagia, pharyngoesophageal phase: Secondary | ICD-10-CM | POA: Diagnosis not present

## 2018-04-29 DIAGNOSIS — K219 Gastro-esophageal reflux disease without esophagitis: Secondary | ICD-10-CM | POA: Diagnosis not present

## 2018-04-30 ENCOUNTER — Other Ambulatory Visit: Payer: Self-pay | Admitting: Otolaryngology

## 2018-04-30 ENCOUNTER — Other Ambulatory Visit: Payer: Self-pay | Admitting: Unknown Physician Specialty

## 2018-04-30 DIAGNOSIS — R1310 Dysphagia, unspecified: Secondary | ICD-10-CM

## 2018-05-01 ENCOUNTER — Other Ambulatory Visit: Payer: Self-pay | Admitting: Gastroenterology

## 2018-05-01 DIAGNOSIS — R1310 Dysphagia, unspecified: Secondary | ICD-10-CM

## 2018-05-05 ENCOUNTER — Ambulatory Visit: Admission: RE | Admit: 2018-05-05 | Payer: PPO | Source: Ambulatory Visit | Admitting: Gastroenterology

## 2018-05-05 ENCOUNTER — Encounter: Admission: RE | Payer: Self-pay | Source: Ambulatory Visit

## 2018-05-05 SURGERY — COLONOSCOPY WITH PROPOFOL
Anesthesia: General

## 2018-05-07 ENCOUNTER — Ambulatory Visit
Admission: RE | Admit: 2018-05-07 | Discharge: 2018-05-07 | Disposition: A | Discharge status: Home / Self Care | Payer: PPO | Source: Ambulatory Visit | Attending: Gastroenterology | Admitting: Gastroenterology

## 2018-05-07 DIAGNOSIS — T17308A Unspecified foreign body in larynx causing other injury, initial encounter: Secondary | ICD-10-CM | POA: Diagnosis not present

## 2018-05-07 DIAGNOSIS — R1310 Dysphagia, unspecified: Secondary | ICD-10-CM | POA: Diagnosis not present

## 2018-05-07 DIAGNOSIS — K449 Diaphragmatic hernia without obstruction or gangrene: Secondary | ICD-10-CM | POA: Insufficient documentation

## 2018-05-12 DIAGNOSIS — R1310 Dysphagia, unspecified: Secondary | ICD-10-CM | POA: Diagnosis not present

## 2018-05-12 DIAGNOSIS — E785 Hyperlipidemia, unspecified: Secondary | ICD-10-CM | POA: Diagnosis not present

## 2018-05-12 DIAGNOSIS — E118 Type 2 diabetes mellitus with unspecified complications: Secondary | ICD-10-CM | POA: Diagnosis not present

## 2018-05-12 DIAGNOSIS — J438 Other emphysema: Secondary | ICD-10-CM | POA: Diagnosis not present

## 2018-05-12 DIAGNOSIS — R531 Weakness: Secondary | ICD-10-CM | POA: Diagnosis not present

## 2018-05-12 DIAGNOSIS — I10 Essential (primary) hypertension: Secondary | ICD-10-CM | POA: Diagnosis not present

## 2018-05-27 ENCOUNTER — Ambulatory Visit
Admission: RE | Admit: 2018-05-27 | Discharge: 2018-05-27 | Disposition: A | Discharge status: Home / Self Care | Payer: PPO | Source: Ambulatory Visit | Attending: Otolaryngology | Admitting: Otolaryngology

## 2018-05-27 DIAGNOSIS — R1310 Dysphagia, unspecified: Secondary | ICD-10-CM | POA: Diagnosis not present

## 2018-05-27 NOTE — Therapy (Signed)
[place], [place], [address] Phone: [phone]   Fax:     Modified Barium Swallow  Patient Details  Name: Darren Sharp MRN: [id number] Date of Birth: 11-17-1946 No data recorded  Encounter Date: 05/27/2018  End of Session - 05/27/18 1324    Visit Number  1    Number of Visits  1    Date for SLP Re-Evaluation  05/27/18    SLP Start Time  1245    SLP Stop Time   1310    SLP Time Calculation (min)  25 min    Activity Tolerance  Patient tolerated treatment well       Past Medical History:  Diagnosis Date  . COPD (chronic obstructive pulmonary disease) ([place])   . COPD (chronic obstructive pulmonary disease) ([place])   . Diabetes mellitus without complication [facility])     Past Surgical History:  Procedure Laterality Date  . APPENDECTOMY    . EYE SURGERY    . HERNIA REPAIR    . KNEE CAPSULOTOMY Right     There were no vitals filed for this visit.  Subjective Assessment - 05/27/18 1403    Currently in Pain?  No/denies          General - 05/27/18 1402      General Information   Date of Onset  05/27/18    HPI  See felow information    Type of Study  MBS-Modified Barium Swallow Study    Diet Prior to this Study  Regular;Thin liquids    Temperature Spikes Noted  N/A    Respiratory Status  Room air    History of Recent Intubation  No        Subjective: Pt was pleasant and cooperative with evaluation. Pt reports that he had surgery on his epiglottis about 20 years ago because he could not swallow. Patient behavior: (alertness, ability to follow instructions, etc.): Pt was alert and cooperative with evaluation. He was able to follow all commands well.  Chief complaint: Pt reports that he has been getting choked on popcorn, fritos, chips and corn. He denies any difficulty with liquids. Pt is currently being followed by ENT and GI. Per ENT notes pt has Laryngopharyngeal reflux. Pt also recently  had a barium swallow completed which revealed one episode of laryngeal penetration of the barium with clearing as well as a non reducible hiatal hernia.    Objective:  Radiological Procedure: A videoflouroscopic evaluation of oral-preparatory, reflex initiation, and pharyngeal phases of the swallow was performed; as well as a screening of the upper esophageal phase.  I. POSTURE:  Pt positioned fully upright in flouro chair. II. VIEW: Lateral III. COMPENSATORY STRATEGIES: Chin tuck, small sips, follow up swallow, liquid wash IV. BOLUSES ADMINISTERED:  Thin Liquid: 5 cup sips  Nectar-thick Liquid: 2 cup sips  Honey-thick Liquid: Not tested  Puree: 2 tsps of puree  Solid: 1 bite of cracker V. RESULTS OF EVALUATION: A. ORAL PREPARATORY PHASE: (The lips, tongue, and velum are observed for strength and coordination)       **Overall Severity Rating: Within functional limits; No oral phase deficits were observed. Pt was able to masticate and clear the solid consistency well. No pocketing or holding of any boluses was observed.   B. SWALLOW INITIATION/REFLEX: (The reflex is normal if "triggered" by the time the bolus reached the base of the tongue)  **Overall Severity Rating: Swallow triggered at the level of the base of  tongue or valleculae for all tested consistencies. Swallow initiation was judged to be within functional limits.   C. PHARYNGEAL PHASE: (Pharyngeal function is normal if the bolus shows rapid, smooth, and continuous transit through the pharynx and there is no pharyngeal residue after the swallow)  **Overall Severity Rating: No significant pharyngeal residue was observed with any tested consistency. Adequate laryngeal elevation and hyoid excursion was observed.   D. LARYNGEAL PENETRATION: (Material entering into the laryngeal inlet/vestibule but not aspirated) Laryngeal penetration was observed x 5 with thin liquid by cup. The penetrated material cleared the airway entrance during the  completion of the swallow.  E. ASPIRATION: No aspiration was observed with any tested consistency.  F. ESOPHAGEAL PHASE: (Screening of the upper esophagus) Pt observed to have a very small amount of residue at the level of the UES with puree and solid consistencies. Pt indicate that he could sense the residue and would throat clear in response. The residue was decreased with follow up dry swallows and liquid wash.   ASSESSMENT: This 72 y/o male presents with no apparent oropharyngeal dysphagia. Oral phase was [facility] for all tested consistencies. No significant swallow delay was observed with any tested consistency and no significant pharyngeal residue was observed. Pt was observed to have laryngeal penetration with cup sips of thin liquid. All penetrated material was cleared during the completion of the swallow. No aspiration was observed. Pt also observed to have a small amount of residue at the level of the UES with puree and solid consistencies. This residue was decreased with follow up swallows and liquid wash of thin liquid. Due to pt's diagnosis of laryngopharyngeal reflux (LPR) (per ENT) suspect that the small amount of residue at the level to the UES could be due to effects of LPR over time on the UES, as pharyngeal swallow/strength appeared within functional limits. Discussed strategies with pt, such as, small bites and sips and follow swallows and sips of liquids. Also discussed reflux precautions. Pt may continue to have difficulty with dry substances such as popcorn and chips due to the slight residue at the level of the UES and he may benefit from sips of liquids with dryer consistencies to aid in clearing. Recommend pt follow up with MD re: reflux precautions/medications as indicated. Pt is judged to be mild aspiration risk but risk improves with use of swallowing strategies and aspiration precautions.   PLAN/RECOMMENDATIONS:  A. Diet: Regular, thin liquids (liquids with dryer consistencies)  B.  Swallowing Precautions: Small bites/sips, follow up sips of liquid, 2 swallows with solids, sit fully upright during and after meals, eat/drink slowly  C. Recommended consultation to ENT and GI  D. Therapy recommendations: none at this time  E. Results and recommendations were discussed with pt and sent to MD                   Dysphagia, unspecified type - Plan: DG OP Swallowing Func-Medicare/Speech Path, DG OP Swallowing Func-Medicare/Speech Path      Prognosis - 05/27/18 1403      Prognosis   Prognosis for Safe Diet Advancement  Good      Individuals Consulted   Consulted and Agree with Results and Recommendations  Patient    Report Sent to   Referring physician       Problem List Patient Active Problem List   Diagnosis Date Noted  . Hypertension 01/28/2017  . Hyperlipidemia 01/28/2017  . Bronchitis 01/28/2017  . History of COPD 01/28/2017  . Hx of retinal  detachment 01/28/2017  . Hx of diabetes mellitus 01/28/2017    Mequon,Maaran 05/27/2018, 2:04 PM  [facility] [place], [place], [address] Phone: [phone]   Fax:     Name: SHLOIME KEILMAN MRN: [id number] Date of Birth: 22-Dec-1945

## 2018-05-29 ENCOUNTER — Ambulatory Visit: Payer: PRIVATE HEALTH INSURANCE

## 2018-06-04 ENCOUNTER — Encounter: Payer: Self-pay | Admitting: *Deleted

## 2018-06-05 ENCOUNTER — Ambulatory Visit: Payer: PPO | Admitting: Anesthesiology

## 2018-06-05 ENCOUNTER — Encounter
Admission: RE | Disposition: A | Discharge status: Home / Self Care | Payer: Self-pay | Source: Ambulatory Visit | Attending: Gastroenterology

## 2018-06-05 ENCOUNTER — Ambulatory Visit
Admission: RE | Admit: 2018-06-05 | Discharge: 2018-06-05 | Disposition: A | Discharge status: Home / Self Care | Payer: PPO | Source: Ambulatory Visit | Attending: Gastroenterology | Admitting: Gastroenterology

## 2018-06-05 ENCOUNTER — Encounter: Payer: Self-pay | Admitting: *Deleted

## 2018-06-05 DIAGNOSIS — R1310 Dysphagia, unspecified: Secondary | ICD-10-CM | POA: Diagnosis not present

## 2018-06-05 DIAGNOSIS — I10 Essential (primary) hypertension: Secondary | ICD-10-CM | POA: Diagnosis not present

## 2018-06-05 DIAGNOSIS — Z79899 Other long term (current) drug therapy: Secondary | ICD-10-CM | POA: Insufficient documentation

## 2018-06-05 DIAGNOSIS — Z7982 Long term (current) use of aspirin: Secondary | ICD-10-CM | POA: Insufficient documentation

## 2018-06-05 DIAGNOSIS — K2900 Acute gastritis without bleeding: Secondary | ICD-10-CM | POA: Insufficient documentation

## 2018-06-05 DIAGNOSIS — K210 Gastro-esophageal reflux disease with esophagitis: Secondary | ICD-10-CM | POA: Diagnosis not present

## 2018-06-05 DIAGNOSIS — J449 Chronic obstructive pulmonary disease, unspecified: Secondary | ICD-10-CM | POA: Diagnosis not present

## 2018-06-05 DIAGNOSIS — K635 Polyp of colon: Secondary | ICD-10-CM | POA: Insufficient documentation

## 2018-06-05 DIAGNOSIS — K640 First degree hemorrhoids: Secondary | ICD-10-CM | POA: Insufficient documentation

## 2018-06-05 DIAGNOSIS — K319 Disease of stomach and duodenum, unspecified: Secondary | ICD-10-CM | POA: Diagnosis not present

## 2018-06-05 DIAGNOSIS — K228 Other specified diseases of esophagus: Secondary | ICD-10-CM | POA: Diagnosis not present

## 2018-06-05 DIAGNOSIS — D120 Benign neoplasm of cecum: Secondary | ICD-10-CM | POA: Diagnosis not present

## 2018-06-05 DIAGNOSIS — E785 Hyperlipidemia, unspecified: Secondary | ICD-10-CM | POA: Diagnosis not present

## 2018-06-05 DIAGNOSIS — K3189 Other diseases of stomach and duodenum: Secondary | ICD-10-CM | POA: Diagnosis not present

## 2018-06-05 DIAGNOSIS — E119 Type 2 diabetes mellitus without complications: Secondary | ICD-10-CM | POA: Insufficient documentation

## 2018-06-05 DIAGNOSIS — Z1211 Encounter for screening for malignant neoplasm of colon: Secondary | ICD-10-CM | POA: Diagnosis not present

## 2018-06-05 DIAGNOSIS — K5730 Diverticulosis of large intestine without perforation or abscess without bleeding: Secondary | ICD-10-CM | POA: Diagnosis not present

## 2018-06-05 DIAGNOSIS — K2950 Unspecified chronic gastritis without bleeding: Secondary | ICD-10-CM | POA: Diagnosis not present

## 2018-06-05 DIAGNOSIS — K2980 Duodenitis without bleeding: Secondary | ICD-10-CM | POA: Insufficient documentation

## 2018-06-05 DIAGNOSIS — K5790 Diverticulosis of intestine, part unspecified, without perforation or abscess without bleeding: Secondary | ICD-10-CM | POA: Diagnosis not present

## 2018-06-05 DIAGNOSIS — K2990 Gastroduodenitis, unspecified, without bleeding: Secondary | ICD-10-CM | POA: Diagnosis not present

## 2018-06-05 DIAGNOSIS — Z87891 Personal history of nicotine dependence: Secondary | ICD-10-CM | POA: Insufficient documentation

## 2018-06-05 HISTORY — PX: ESOPHAGOGASTRODUODENOSCOPY (EGD) WITH PROPOFOL: SHX5813

## 2018-06-05 HISTORY — PX: COLONOSCOPY WITH PROPOFOL: SHX5780

## 2018-06-05 SURGERY — COLONOSCOPY WITH PROPOFOL
Anesthesia: General

## 2018-06-05 MED ORDER — PROPOFOL 10 MG/ML IV BOLUS
INTRAVENOUS | Status: AC
  Filled 2018-06-05: qty 20

## 2018-06-05 MED ORDER — PHENYLEPHRINE HCL 10 MG/ML IJ SOLN
INTRAMUSCULAR | Status: DC | PRN
  Administered 2018-06-05 (×5): 200 ug via INTRAVENOUS

## 2018-06-05 MED ORDER — PROPOFOL 500 MG/50ML IV EMUL
INTRAVENOUS | Status: DC | PRN
  Administered 2018-06-05: 100 ug/kg/min via INTRAVENOUS

## 2018-06-05 MED ORDER — LIDOCAINE HCL (CARDIAC) PF 100 MG/5ML IV SOSY
PREFILLED_SYRINGE | INTRAVENOUS | Status: DC | PRN
  Administered 2018-06-05: 80 mg via INTRAVENOUS

## 2018-06-05 MED ORDER — PROPOFOL 10 MG/ML IV BOLUS
INTRAVENOUS | Status: AC
  Filled 2018-06-05: qty 40

## 2018-06-05 MED ORDER — LIDOCAINE HCL (PF) 2 % IJ SOLN
INTRAMUSCULAR | Status: AC
  Filled 2018-06-05: qty 10

## 2018-06-05 MED ORDER — PROPOFOL 10 MG/ML IV BOLUS
INTRAVENOUS | Status: DC | PRN
  Administered 2018-06-05: 60 mg via INTRAVENOUS

## 2018-06-05 MED ORDER — IPRATROPIUM-ALBUTEROL 0.5-2.5 (3) MG/3ML IN SOLN
RESPIRATORY_TRACT | Status: AC
  Administered 2018-06-05: 3 mL via RESPIRATORY_TRACT
  Filled 2018-06-05: qty 3

## 2018-06-05 MED ORDER — IPRATROPIUM-ALBUTEROL 0.5-2.5 (3) MG/3ML IN SOLN
3.0000 mL | Freq: Once | RESPIRATORY_TRACT | Status: AC
Start: 2018-06-05 — End: 2018-06-05
  Administered 2018-06-05: 3 mL via RESPIRATORY_TRACT

## 2018-06-05 MED ORDER — FENTANYL CITRATE (PF) 100 MCG/2ML IJ SOLN
INTRAMUSCULAR | Status: AC
  Filled 2018-06-05: qty 2

## 2018-06-05 MED ORDER — FENTANYL CITRATE (PF) 100 MCG/2ML IJ SOLN
INTRAMUSCULAR | Status: DC | PRN
  Administered 2018-06-05: 50 ug via INTRAVENOUS

## 2018-06-05 MED ORDER — SODIUM CHLORIDE 0.9 % IV SOLN
INTRAVENOUS | Status: DC
  Administered 2018-06-05: 1000 mL via INTRAVENOUS

## 2018-06-05 NOTE — Op Note (Addendum)
[facility]astroenterology Patient Name: Darren Sharp Procedure Date: 06/05/2018 8:11 AM MRN: [id number] Account #: [id number] Date of Birth: August 26, 1946 Admit Type: Outpatient Age: 72 Room: [facility] ENDO ROOM 1 Gender: Male Note Status: Finalized Procedure:            Colonoscopy Indications:          Screening for colorectal malignant neoplasm, This is                        the patient's first colonoscopy Providers:            Lollie Sails, MD Medicines:            Monitored Anesthesia Care Complications:        No immediate complications. Procedure:            Pre-Anesthesia Assessment:                       - ASA Grade Assessment: III - A patient with severe                        systemic disease.                       After obtaining informed consent, the colonoscope was                        passed under direct vision. Throughout the procedure,                        the patient's blood pressure, pulse, and oxygen                        saturations were monitored continuously. The                        Colonoscope was introduced through the anus and                        advanced to the the cecum, identified by appendiceal                        orifice and ileocecal valve. The colonoscopy was                        performed without difficulty. The patient tolerated the                        procedure well. The quality of the bowel preparation                        was fair. Findings:      A few small-mouthed diverticula were found in the sigmoid colon and       descending colon.      Three sessile polyps were found in the cecum. The polyps were 1 to 5 mm       in size. These one of these polyps were removed with a cold biopsy       forceps, 2 removed with a cold snare. Resection and retrieval were       complete.      Non-bleeding internal hemorrhoids were found  during anoscopy. The       hemorrhoids were small and Grade I (internal hemorrhoids  that do not       prolapse).      The digital rectal exam was normal. Impression:           - Preparation of the colon was fair.                       - Diverticulosis in the sigmoid colon and in the                        descending colon.                       - Three 1 to 5 mm polyps in the cecum, removed with a                        cold biopsy forceps. Resected and retrieved.                       - Non-bleeding internal hemorrhoids. Recommendation:       - Discharge patient to home.                       - Soft diet today, then advance as tolerated to advance                        diet as tolerated.                       - Return to GI clinic in 1 month. Procedure Code(s):    --- Professional ---                       [phone], Colonoscopy, flexible; with biopsy, single or                        multiple Diagnosis Code(s):    --- Professional ---                       Z12.11, Encounter for screening for malignant neoplasm                        of colon                       K64.0, First degree hemorrhoids                       D12.0, Benign neoplasm of cecum                       K57.30, Diverticulosis of large intestine without                        perforation or abscess without bleeding CPT copyright 2017 American Medical Association. All rights reserved. The codes documented in this report are preliminary and upon coder review may  be revised to meet current compliance requirements. Lollie Sails, MD 06/05/2018 9:16:11 AM This report has been signed electronically. Number of Addenda: 0 Note Initiated On: 06/05/2018 8:11 AM Scope Withdrawal Time: 0 hours 15 minutes 12 seconds  Total Procedure  Duration: 0 hours 20 minutes 32 seconds       [facility]

## 2018-06-05 NOTE — Anesthesia Postprocedure Evaluation (Signed)
Anesthesia Post Note  Patient: Darren Sharp  Procedure(s) Performed: COLONOSCOPY WITH PROPOFOL (N/A ) ESOPHAGOGASTRODUODENOSCOPY (EGD) WITH PROPOFOL (N/A )  Patient location during evaluation: Endoscopy Anesthesia Type: General Level of consciousness: awake and alert Pain management: pain level controlled Vital Signs Assessment: post-procedure vital signs reviewed and stable Respiratory status: spontaneous breathing, nonlabored ventilation, respiratory function stable and patient connected to nasal cannula oxygen Cardiovascular status: blood pressure returned to baseline and stable Postop Assessment: no apparent nausea or vomiting Anesthetic complications: no     Last Vitals:  Vitals:   06/05/18 0930 06/05/18 0940  BP: (!) 115/58 132/65  Pulse: 78 68  Resp: 14 (!) 26  Temp:    SpO2: 99% 99%    Last Pain:  Vitals:   06/05/18 0910  TempSrc: Tympanic  PainSc:                  Precious Haws Piscitello

## 2018-06-05 NOTE — Anesthesia Preprocedure Evaluation (Addendum)
Anesthesia Evaluation  Patient identified by MRN, date of birth, ID band Patient awake    Reviewed: Allergy & Precautions, H&P , NPO status , Patient's Chart, lab work & pertinent test results  History of Anesthesia Complications Negative for: history of anesthetic complications  Airway Mallampati: III  TM Distance: <3 FB Neck ROM: limited    Dental  (+) Chipped, Missing, Poor Dentition, Upper Dentures, Partial Lower   Pulmonary neg shortness of breath, COPD, former smoker,           Cardiovascular Exercise Tolerance: Good hypertension, (-) angina(-) Past MI and (-) DOE      Neuro/Psych negative neurological ROS  negative psych ROS   GI/Hepatic negative GI ROS, Neg liver ROS, neg GERD  ,  Endo/Other  diabetes, Type 2  Renal/GU negative Renal ROS  negative genitourinary   Musculoskeletal   Abdominal   Peds  Hematology negative hematology ROS (+)   Anesthesia Other Findings Past Medical History: No date: COPD (chronic obstructive pulmonary disease) ([facility]) No date: COPD (chronic obstructive pulmonary disease) ([facility]) No date: Diabetes mellitus without complication ([facility])  Past Surgical History: No date: APPENDECTOMY No date: EYE SURGERY No date: HERNIA REPAIR No date: KNEE CAPSULOTOMY; Right  BMI    Body Mass Index:  23.97 kg/m      Reproductive/Obstetrics negative OB ROS                             Anesthesia Physical Anesthesia Plan  ASA: III  Anesthesia Plan: General   Post-op Pain Management:    Induction: Intravenous  PONV Risk Score and Plan: Propofol infusion and TIVA  Airway Management Planned: Natural Airway and Nasal Cannula  Additional Equipment:   Intra-op Plan:   Post-operative Plan:   Informed Consent: I have reviewed the patients History and Physical, chart, labs and discussed the procedure including the risks, benefits and alternatives for the proposed  anesthesia with the patient or authorized representative who has indicated his/her understanding and acceptance.   Dental Advisory Given  Plan Discussed with: Anesthesiologist, CRNA and Surgeon  Anesthesia Plan Comments: (Patient consented for risks of anesthesia including but not limited to:  - adverse reactions to medications - risk of intubation if required - damage to teeth, lips or other oral mucosa - sore throat or hoarseness - Damage to heart, brain, lungs or loss of life  Patient voiced understanding.)        Anesthesia Quick Evaluation

## 2018-06-05 NOTE — Transfer of Care (Signed)
Immediate Anesthesia Transfer of Care Note  Patient: Darren Sharp  Procedure(s) Performed: COLONOSCOPY WITH PROPOFOL (N/A ) ESOPHAGOGASTRODUODENOSCOPY (EGD) WITH PROPOFOL (N/A )  Patient Location: PACU  Anesthesia Type:General  Level of Consciousness: awake, alert  and oriented  Airway & Oxygen Therapy: Patient Spontanous Breathing and Patient connected to nasal cannula oxygen  Post-op Assessment: Report given to RN and Post -op Vital signs reviewed and stable  Post vital signs: Reviewed and stable  Last Vitals:  Vitals Value Taken Time  BP 106/65 06/05/2018  9:19 AM  Temp 35.9 C 06/05/2018  9:10 AM  Pulse 80 06/05/2018  9:19 AM  Resp 16 06/05/2018  9:19 AM  SpO2 99 % 06/05/2018  9:19 AM  Vitals shown include unvalidated device data.  Last Pain:  Vitals:   06/05/18 0910  TempSrc: Tympanic  PainSc:          Complications: No apparent anesthesia complications and Patient re-intubated

## 2018-06-05 NOTE — Anesthesia Post-op Follow-up Note (Signed)
Anesthesia QCDR form completed.        

## 2018-06-05 NOTE — H&P (Signed)
Outpatient short stay form Pre-procedure 06/05/2018 8:24 AM Lollie Sails MD  Primary Physician: Harrel Lemon, MD  Reason for visit: EGD and colonoscopy  History of present illness: Patient is a 72 year old male presenting today as above.  He is never had a colonoscopy before and he is having a screening done today.  He is also had some issues with coughing and dysphagia when he was eating and drinking.  ENT evaluation was otherwise normal.  EGD was recommended.  This was apparently 2 more dry issues like Fritos potato chips popcorn.  Was not taking any PPI at that time.  He was also taking lisinopril.  That was changed and both he and his wife say that he is improved since then.  He does not regurgitate foods.  Sometimes cough material goes into the airway.  Takes no aspirin or blood thinning agent.    Current Facility-Administered Medications:  .  0.9 %  sodium chloride infusion, , Intravenous, Continuous, Lollie Sails, MD, Last Rate: 20 mL/hr at 06/05/18 0753  Medications Prior to Admission  Medication Sig Dispense Refill Last Dose  . aspirin 81 MG tablet Take 1 tablet (81 mg total) by mouth daily.   06/04/2018 at Unknown time  . atorvastatin (LIPITOR) 40 MG tablet Take 40 mg by mouth daily.   Past Week at Unknown time  . hydrochlorothiazide (HYDRODIURIL) 12.5 MG tablet Take 12.5 mg by mouth daily.   06/04/2018 at Unknown time  . losartan (COZAAR) 50 MG tablet Take 50 mg by mouth daily.   06/05/2018 at 0600  . Multiple Vitamin (MULTIVITAMIN) tablet Take 1 tablet by mouth daily.   Past Week at Unknown time  . PROAIR HFA 108 (90 Base) MCG/ACT inhaler INHALE 2 PUFFS BY MOUTH EVERY 4 HOURS ASNEEDED FOR WHEEZING OR SHORTNESS OF BREATH 8.5 g 0 06/05/2018 at 0600  . lisinopril (PRINIVIL,ZESTRIL) 20 MG tablet TAKE ONE (1) TABLET BY MOUTH ONCE DAILY (Patient not taking: Reported on 06/05/2018) 30 tablet 0 Not Taking at Unknown time     Allergies  Allergen Reactions  . Penicillins Swelling      Past Medical History:  Diagnosis Date  . COPD (chronic obstructive pulmonary disease) ([place])   . COPD (chronic obstructive pulmonary disease) ([place])   . Diabetes mellitus without complication ([place])     Review of systems:      Physical Exam    Heart and lungs: Regular rate and rhythm without rub or gallop, lungs are bilaterally clear.    HEENT: Normocephalic atraumatic eyes are anicteric    Other:    Pertinant exam for procedure: Soft nontender nondistended bowel sounds positive normoactive    Planned proceedures: EGD, colonoscopy and indicated procedures.  Of note patient did have a barium swallow on 05/07/2018.  That indicated one episode of laryngeal penetration of barium which probably coughed caused a cough and cleared the barium.  There is no evidence of stricture esophagitis or esophageal dysmotility.  There is a small nonreducible hiatal hernia without evidence of reflux.  Modified barium swallow however final results are not available for that yet.  Is not currently on a proton pump inhibitor.    Lollie Sails, MD Gastroenterology 06/05/2018  8:24 AM

## 2018-06-05 NOTE — Op Note (Addendum)
[facility]astroenterology Patient Name: Darren Sharp Procedure Date: 06/05/2018 8:12 AM MRN: [id number] Account #: [id number] Date of Birth: 1946-08-29 Admit Type: Outpatient Age: 72 Room: [facility] ENDO ROOM 1 Gender: Male Note Status: Finalized Procedure:            Upper GI endoscopy Indications:          Dysphagia Providers:            Lollie Sails, MD Referring MD:         Andres Labrum, MD (Referring MD) Medicines:            Monitored Anesthesia Care Complications:        No immediate complications. Procedure:            Pre-Anesthesia Assessment:                       - ASA Grade Assessment: III - A patient with severe                        systemic disease.                       After obtaining informed consent, the endoscope was                        passed under direct vision. Throughout the procedure,                        the patient's blood pressure, pulse, and oxygen                        saturations were monitored continuously. The Endoscope                        was introduced through the mouth, and advanced to the                        third part of duodenum. The upper GI endoscopy was                        accomplished without difficulty. The patient tolerated                        the procedure well. Findings:      The Z-line was variable.      LA Grade A (one or more mucosal breaks less than 5 mm, not extending       between tops of 2 mucosal folds) esophagitis with no bleeding was found.       Biopsies were taken with a cold forceps for histology.      The exam of the esophagus was otherwise normal. No evidence of stricture       ring or stenosis, though the cricopharyngeal muscle was a little       prominant.      Patchy mild inflammation characterized by congestion (edema), erosions       and erythema was found in the gastric antrum. Biopsies were taken with a       cold forceps for histology. Biopsies were taken with a cold  forceps for       Helicobacter pylori testing.      Patchy mild inflammation  characterized by congestion (edema) and       erythema was found in the gastric body. Biopsies were taken with a cold       forceps for histology. Biopsies were taken with a cold forceps for       Helicobacter pylori testing.      The cardia and gastric fundus were normal on retroflexion.      Patchy mild inflammation characterized by congestion (edema) and       erythema was found in the duodenal bulb.      The exam of the duodenum was otherwise normal. Impression:           - Z-line variable.                       - LA Grade A reflux esophagitis. Biopsied.                       - Erosive gastritis. Biopsied.                       - Erosive gastritis. Biopsied.                       - Duodenitis. Recommendation:       - Await pathology results.                       - Use Protonix (pantoprazole) 40 mg PO daily daily.                       - Return to GI clinic in 4 weeks. Procedure Code(s):    --- Professional ---                       [phone], Esophagogastroduodenoscopy, flexible, transoral;                        with biopsy, single or multiple Diagnosis Code(s):    --- Professional ---                       K22.8, Other specified diseases of esophagus                       K21.0, Gastro-esophageal reflux disease with esophagitis                       K29.60, Other gastritis without bleeding                       K29.80, Duodenitis without bleeding                       R13.10, Dysphagia, unspecified CPT copyright 2017 American Medical Association. All rights reserved. The codes documented in this report are preliminary and upon coder review may  be revised to meet current compliance requirements. Lollie Sails, MD 06/05/2018 8:48:15 AM This report has been signed electronically. Number of Addenda: 0 Note Initiated On: 06/05/2018 8:12 AM      [facility]

## 2018-06-06 ENCOUNTER — Encounter: Payer: Self-pay | Admitting: Gastroenterology

## 2018-06-06 LAB — SURGICAL PATHOLOGY

## 2018-07-28 DIAGNOSIS — E118 Type 2 diabetes mellitus with unspecified complications: Secondary | ICD-10-CM | POA: Diagnosis not present

## 2018-08-11 DIAGNOSIS — K2970 Gastritis, unspecified, without bleeding: Secondary | ICD-10-CM | POA: Diagnosis not present

## 2018-08-11 DIAGNOSIS — E785 Hyperlipidemia, unspecified: Secondary | ICD-10-CM | POA: Diagnosis not present

## 2018-08-11 DIAGNOSIS — J438 Other emphysema: Secondary | ICD-10-CM | POA: Diagnosis not present

## 2018-08-11 DIAGNOSIS — E118 Type 2 diabetes mellitus with unspecified complications: Secondary | ICD-10-CM | POA: Diagnosis not present

## 2018-08-11 DIAGNOSIS — I10 Essential (primary) hypertension: Secondary | ICD-10-CM | POA: Diagnosis not present

## 2018-11-04 DIAGNOSIS — E118 Type 2 diabetes mellitus with unspecified complications: Secondary | ICD-10-CM | POA: Diagnosis not present

## 2018-11-04 DIAGNOSIS — E611 Iron deficiency: Secondary | ICD-10-CM | POA: Diagnosis not present

## 2018-11-04 DIAGNOSIS — E785 Hyperlipidemia, unspecified: Secondary | ICD-10-CM | POA: Diagnosis not present

## 2018-11-10 DIAGNOSIS — E118 Type 2 diabetes mellitus with unspecified complications: Secondary | ICD-10-CM | POA: Diagnosis not present

## 2018-11-10 DIAGNOSIS — Z0001 Encounter for general adult medical examination with abnormal findings: Secondary | ICD-10-CM | POA: Diagnosis not present

## 2018-11-10 DIAGNOSIS — I10 Essential (primary) hypertension: Secondary | ICD-10-CM | POA: Diagnosis not present

## 2018-11-10 DIAGNOSIS — E785 Hyperlipidemia, unspecified: Secondary | ICD-10-CM | POA: Diagnosis not present

## 2018-11-10 DIAGNOSIS — J438 Other emphysema: Secondary | ICD-10-CM | POA: Diagnosis not present

## 2018-12-08 DIAGNOSIS — R0609 Other forms of dyspnea: Secondary | ICD-10-CM | POA: Diagnosis not present

## 2018-12-08 DIAGNOSIS — J42 Unspecified chronic bronchitis: Secondary | ICD-10-CM | POA: Diagnosis not present

## 2018-12-11 DIAGNOSIS — R0609 Other forms of dyspnea: Secondary | ICD-10-CM | POA: Diagnosis not present

## 2018-12-11 DIAGNOSIS — J42 Unspecified chronic bronchitis: Secondary | ICD-10-CM | POA: Diagnosis not present

## 2019-01-27 DIAGNOSIS — H109 Unspecified conjunctivitis: Secondary | ICD-10-CM | POA: Diagnosis not present

## 2019-02-05 DIAGNOSIS — M25562 Pain in left knee: Secondary | ICD-10-CM | POA: Diagnosis not present

## 2019-02-06 DIAGNOSIS — S83272A Complex tear of lateral meniscus, current injury, left knee, initial encounter: Secondary | ICD-10-CM | POA: Diagnosis not present

## 2019-02-06 DIAGNOSIS — Z8739 Personal history of other diseases of the musculoskeletal system and connective tissue: Secondary | ICD-10-CM | POA: Diagnosis not present

## 2019-02-09 DIAGNOSIS — E118 Type 2 diabetes mellitus with unspecified complications: Secondary | ICD-10-CM | POA: Diagnosis not present

## 2019-02-11 DIAGNOSIS — S83272A Complex tear of lateral meniscus, current injury, left knee, initial encounter: Secondary | ICD-10-CM | POA: Diagnosis not present

## 2019-02-16 DIAGNOSIS — E785 Hyperlipidemia, unspecified: Secondary | ICD-10-CM | POA: Diagnosis not present

## 2019-02-16 DIAGNOSIS — Z0000 Encounter for general adult medical examination without abnormal findings: Secondary | ICD-10-CM | POA: Diagnosis not present

## 2019-02-16 DIAGNOSIS — I10 Essential (primary) hypertension: Secondary | ICD-10-CM | POA: Diagnosis not present

## 2019-02-16 DIAGNOSIS — J438 Other emphysema: Secondary | ICD-10-CM | POA: Diagnosis not present

## 2019-02-16 DIAGNOSIS — E118 Type 2 diabetes mellitus with unspecified complications: Secondary | ICD-10-CM | POA: Diagnosis not present

## 2019-02-18 ENCOUNTER — Other Ambulatory Visit: Payer: Self-pay | Admitting: Orthopedic Surgery

## 2019-02-18 DIAGNOSIS — M25562 Pain in left knee: Secondary | ICD-10-CM

## 2019-03-10 DIAGNOSIS — J42 Unspecified chronic bronchitis: Secondary | ICD-10-CM | POA: Diagnosis not present

## 2019-03-10 DIAGNOSIS — R0609 Other forms of dyspnea: Secondary | ICD-10-CM | POA: Diagnosis not present

## 2019-03-10 DIAGNOSIS — J4540 Moderate persistent asthma, uncomplicated: Secondary | ICD-10-CM | POA: Diagnosis not present

## 2019-03-12 ENCOUNTER — Telehealth: Payer: Self-pay | Admitting: *Deleted

## 2019-03-12 NOTE — Telephone Encounter (Signed)
Received referral for lung screening, contacted patient and informed that we will schedule lung screening scan as soon as restrictions allow.

## 2019-04-01 ENCOUNTER — Other Ambulatory Visit: Payer: Self-pay | Admitting: Orthopedic Surgery

## 2019-04-01 DIAGNOSIS — M25562 Pain in left knee: Secondary | ICD-10-CM

## 2019-04-07 ENCOUNTER — Other Ambulatory Visit: Payer: Self-pay | Admitting: Orthopedic Surgery

## 2019-04-07 DIAGNOSIS — M25562 Pain in left knee: Secondary | ICD-10-CM

## 2019-04-15 ENCOUNTER — Encounter: Payer: Self-pay | Admitting: *Deleted

## 2019-04-15 ENCOUNTER — Telehealth: Payer: Self-pay | Admitting: *Deleted

## 2019-04-15 DIAGNOSIS — Z122 Encounter for screening for malignant neoplasm of respiratory organs: Secondary | ICD-10-CM

## 2019-04-15 DIAGNOSIS — Z87891 Personal history of nicotine dependence: Secondary | ICD-10-CM

## 2019-04-15 NOTE — Telephone Encounter (Signed)
Received referral for initial lung cancer screening scan. Contacted patient and obtained smoking history,(former, quit 01/28/10, 49 pack year) as well as answering questions related to screening process. Patient denies signs of lung cancer such as weight loss or hemoptysis. Patient denies comorbidity that would prevent curative treatment if lung cancer were found. Patient is scheduled for shared decision making visit and CT scan on 04/20/19 at 1130am.

## 2019-04-20 ENCOUNTER — Inpatient Hospital Stay: Payer: PPO | Admitting: Oncology

## 2019-04-20 ENCOUNTER — Ambulatory Visit: Payer: PPO

## 2019-05-11 DIAGNOSIS — E118 Type 2 diabetes mellitus with unspecified complications: Secondary | ICD-10-CM | POA: Diagnosis not present

## 2019-05-11 DIAGNOSIS — J432 Centrilobular emphysema: Secondary | ICD-10-CM | POA: Diagnosis not present

## 2019-05-11 DIAGNOSIS — J449 Chronic obstructive pulmonary disease, unspecified: Secondary | ICD-10-CM | POA: Diagnosis not present

## 2019-05-11 DIAGNOSIS — J410 Simple chronic bronchitis: Secondary | ICD-10-CM | POA: Diagnosis not present

## 2019-05-12 DIAGNOSIS — J449 Chronic obstructive pulmonary disease, unspecified: Secondary | ICD-10-CM | POA: Diagnosis not present

## 2019-06-05 DIAGNOSIS — J449 Chronic obstructive pulmonary disease, unspecified: Secondary | ICD-10-CM | POA: Diagnosis not present

## 2019-06-11 DIAGNOSIS — J449 Chronic obstructive pulmonary disease, unspecified: Secondary | ICD-10-CM | POA: Diagnosis not present

## 2019-06-25 ENCOUNTER — Telehealth: Payer: Self-pay | Admitting: *Deleted

## 2019-06-25 NOTE — Telephone Encounter (Signed)
Contacted regarding lung screening scan and patient requests to have call back and schedule in September.

## 2019-07-12 DIAGNOSIS — J449 Chronic obstructive pulmonary disease, unspecified: Secondary | ICD-10-CM | POA: Diagnosis not present

## 2019-07-20 DIAGNOSIS — M1712 Unilateral primary osteoarthritis, left knee: Secondary | ICD-10-CM | POA: Diagnosis not present

## 2019-07-20 DIAGNOSIS — M25562 Pain in left knee: Secondary | ICD-10-CM | POA: Diagnosis not present

## 2019-07-21 ENCOUNTER — Telehealth: Payer: Self-pay | Admitting: *Deleted

## 2019-07-21 DIAGNOSIS — Z122 Encounter for screening for malignant neoplasm of respiratory organs: Secondary | ICD-10-CM

## 2019-07-21 DIAGNOSIS — Z87891 Personal history of nicotine dependence: Secondary | ICD-10-CM

## 2019-07-21 NOTE — Telephone Encounter (Signed)
Received referral for initial lung cancer screening scan. Contacted patient and obtained smoking history,(former, quit 01/28/10, 49 pack year) as well as answering questions related to screening process. Patient denies signs of lung cancer such as weight loss or hemoptysis. Patient denies comorbidity that would prevent curative treatment if lung cancer were found. Patient is scheduled for shared decision making visit and CT scan on 08/03/19 at 1045am.

## 2019-08-03 ENCOUNTER — Other Ambulatory Visit: Payer: Self-pay

## 2019-08-03 ENCOUNTER — Inpatient Hospital Stay: Payer: PPO | Attending: Nurse Practitioner | Admitting: Nurse Practitioner

## 2019-08-03 ENCOUNTER — Ambulatory Visit
Admission: RE | Admit: 2019-08-03 | Discharge: 2019-08-03 | Disposition: A | Discharge status: Home / Self Care | Payer: PPO | Source: Ambulatory Visit | Attending: Nurse Practitioner | Admitting: Nurse Practitioner

## 2019-08-03 DIAGNOSIS — Z87891 Personal history of nicotine dependence: Secondary | ICD-10-CM | POA: Insufficient documentation

## 2019-08-03 DIAGNOSIS — Z122 Encounter for screening for malignant neoplasm of respiratory organs: Secondary | ICD-10-CM

## 2019-08-03 NOTE — Progress Notes (Signed)
Virtual Visit via Video Enabled Telemedicine Note   I connected with Darren Sharp on 08/03/19 at 10:15 AM EST by video enabled telemedicine visit and verified that I am speaking with the correct person using two identifiers.   I discussed the limitations, risks, security and privacy concerns of performing an evaluation and management service by telemedicine and the availability of in-person appointments. I also discussed with the patient that there may be a patient responsible charge related to this service. The patient expressed understanding and agreed to proceed.   Other persons participating in the visit and their role in the encounter: Burgess Estelle, RN- checking in patient & navigation  Patient's location: clinic  Provider's location: home  Chief Complaint: Low Dose CT Screening  Patient agreed to evaluation by telemedicine to discuss shared decision making for consideration of low dose CT lung cancer screening.    In accordance with CMS guidelines, patient has met eligibility criteria including age, absence of signs or symptoms of lung cancer.  Social History   Tobacco Use  . Smoking status: Former Smoker    Packs/day: 1.00    Years: 49.00    Pack years: 49.00    Types: Cigarettes    Quit date: 01/28/2010    Years since quitting: 9.5  . Smokeless tobacco: Never Used  Substance Use Topics  . Alcohol use: Yes    Alcohol/week: 1.0 standard drinks    Types: 1 Cans of beer per week    Comment: 4 or more times a week      A shared decision-making session was conducted prior to the performance of CT scan. This includes one or more decision aids, includes benefits and harms of screening, follow-up diagnostic testing, over-diagnosis, false positive rate, and total radiation exposure.   Counseling on the importance of adherence to annual lung cancer LDCT screening, impact of co-morbidities, and ability or willingness to undergo diagnosis and treatment is imperative for compliance  of the program.   Counseling on the importance of continued smoking cessation for former smokers; the importance of smoking cessation for current smokers, and information about tobacco cessation interventions have been given to patient including [place] and [employer] Quit  programs.   Written order for lung cancer screening with LDCT has been given to the patient and any and all questions have been answered to the best of my abilities.    Yearly follow up will be coordinated by Burgess Estelle, Thoracic Navigator.  I discussed the assessment and treatment plan with the patient. The patient was provided an opportunity to ask questions and all were answered. The patient agreed with the plan and demonstrated an understanding of the instructions.   The patient was advised to call back or seek an in-person evaluation if the symptoms worsen or if the condition fails to improve as anticipated.   I provided 15 minutes of face-to-face video visit time during this encounter, and > 50% was spent counseling as documented under my assessment & plan.   Beckey Rutter, DNP, AGNP-C [place] [facility] [phone] (work cell) [phone] (office)

## 2019-08-04 ENCOUNTER — Telehealth: Payer: Self-pay | Admitting: *Deleted

## 2019-08-04 NOTE — Telephone Encounter (Signed)
Notified patient of LDCT lung cancer screening program results with recommendation for 12 month follow up imaging. Also notified of incidental findings noted below and is encouraged to discuss further with PCP who will receive a copy of this note and/or the CT report. Patient verbalizes understanding.   IMPRESSION: Lung-RADS 2, benign appearance or behavior. Continue annual screening with low-dose chest CT without contrast in 12 months.  Aortic Atherosclerosis (ICD10-I70.0) and Emphysema (ICD10-J43 

## 2019-08-12 DIAGNOSIS — J449 Chronic obstructive pulmonary disease, unspecified: Secondary | ICD-10-CM | POA: Diagnosis not present

## 2019-09-11 DIAGNOSIS — J449 Chronic obstructive pulmonary disease, unspecified: Secondary | ICD-10-CM | POA: Diagnosis not present

## 2019-09-14 DIAGNOSIS — Z0000 Encounter for general adult medical examination without abnormal findings: Secondary | ICD-10-CM | POA: Insufficient documentation

## 2019-09-14 DIAGNOSIS — J431 Panlobular emphysema: Secondary | ICD-10-CM | POA: Insufficient documentation

## 2019-09-14 DIAGNOSIS — I700 Atherosclerosis of aorta: Secondary | ICD-10-CM | POA: Diagnosis not present

## 2019-09-14 DIAGNOSIS — I10 Essential (primary) hypertension: Secondary | ICD-10-CM | POA: Insufficient documentation

## 2019-09-14 DIAGNOSIS — E1169 Type 2 diabetes mellitus with other specified complication: Secondary | ICD-10-CM | POA: Insufficient documentation

## 2019-09-14 DIAGNOSIS — Z125 Encounter for screening for malignant neoplasm of prostate: Secondary | ICD-10-CM | POA: Diagnosis not present

## 2019-09-14 DIAGNOSIS — E782 Mixed hyperlipidemia: Secondary | ICD-10-CM | POA: Diagnosis not present

## 2019-11-11 DIAGNOSIS — Z20828 Contact with and (suspected) exposure to other viral communicable diseases: Secondary | ICD-10-CM | POA: Diagnosis not present

## 2019-11-16 DIAGNOSIS — Z20828 Contact with and (suspected) exposure to other viral communicable diseases: Secondary | ICD-10-CM | POA: Diagnosis not present

## 2019-12-07 DIAGNOSIS — E118 Type 2 diabetes mellitus with unspecified complications: Secondary | ICD-10-CM | POA: Diagnosis not present

## 2019-12-14 DIAGNOSIS — R509 Fever, unspecified: Secondary | ICD-10-CM | POA: Diagnosis not present

## 2019-12-14 DIAGNOSIS — J40 Bronchitis, not specified as acute or chronic: Secondary | ICD-10-CM | POA: Diagnosis not present

## 2019-12-14 DIAGNOSIS — Z20822 Contact with and (suspected) exposure to covid-19: Secondary | ICD-10-CM | POA: Diagnosis not present

## 2019-12-23 ENCOUNTER — Ambulatory Visit
Admission: RE | Admit: 2019-12-23 | Discharge: 2019-12-23 | Disposition: A | Discharge status: Home / Self Care | Payer: PPO | Source: Ambulatory Visit | Attending: Internal Medicine | Admitting: Internal Medicine

## 2019-12-23 ENCOUNTER — Other Ambulatory Visit: Payer: Self-pay

## 2019-12-23 ENCOUNTER — Other Ambulatory Visit: Payer: Self-pay | Admitting: Internal Medicine

## 2019-12-23 ENCOUNTER — Other Ambulatory Visit (HOSPITAL_COMMUNITY): Payer: Self-pay | Admitting: Internal Medicine

## 2019-12-23 DIAGNOSIS — J431 Panlobular emphysema: Secondary | ICD-10-CM | POA: Diagnosis not present

## 2019-12-23 DIAGNOSIS — I700 Atherosclerosis of aorta: Secondary | ICD-10-CM | POA: Diagnosis not present

## 2019-12-23 DIAGNOSIS — L03115 Cellulitis of right lower limb: Secondary | ICD-10-CM | POA: Diagnosis not present

## 2019-12-23 DIAGNOSIS — R600 Localized edema: Secondary | ICD-10-CM

## 2019-12-23 DIAGNOSIS — E1169 Type 2 diabetes mellitus with other specified complication: Secondary | ICD-10-CM | POA: Diagnosis not present

## 2019-12-23 DIAGNOSIS — E782 Mixed hyperlipidemia: Secondary | ICD-10-CM | POA: Diagnosis not present

## 2019-12-30 DIAGNOSIS — Z125 Encounter for screening for malignant neoplasm of prostate: Secondary | ICD-10-CM | POA: Diagnosis not present

## 2019-12-30 DIAGNOSIS — J431 Panlobular emphysema: Secondary | ICD-10-CM | POA: Diagnosis not present

## 2019-12-30 DIAGNOSIS — E782 Mixed hyperlipidemia: Secondary | ICD-10-CM | POA: Diagnosis not present

## 2019-12-30 DIAGNOSIS — E538 Deficiency of other specified B group vitamins: Secondary | ICD-10-CM | POA: Diagnosis not present

## 2019-12-30 DIAGNOSIS — R0600 Dyspnea, unspecified: Secondary | ICD-10-CM | POA: Diagnosis not present

## 2019-12-30 DIAGNOSIS — I739 Peripheral vascular disease, unspecified: Secondary | ICD-10-CM | POA: Diagnosis not present

## 2019-12-30 DIAGNOSIS — E1169 Type 2 diabetes mellitus with other specified complication: Secondary | ICD-10-CM | POA: Diagnosis not present

## 2019-12-30 DIAGNOSIS — R5383 Other fatigue: Secondary | ICD-10-CM | POA: Diagnosis not present

## 2019-12-30 DIAGNOSIS — D369 Benign neoplasm, unspecified site: Secondary | ICD-10-CM | POA: Insufficient documentation

## 2020-01-11 ENCOUNTER — Encounter (INDEPENDENT_AMBULATORY_CARE_PROVIDER_SITE_OTHER): Payer: Self-pay | Admitting: Vascular Surgery

## 2020-01-11 ENCOUNTER — Other Ambulatory Visit: Payer: Self-pay

## 2020-01-11 ENCOUNTER — Ambulatory Visit (INDEPENDENT_AMBULATORY_CARE_PROVIDER_SITE_OTHER): Payer: PPO | Admitting: Vascular Surgery

## 2020-01-11 VITALS — BP 178/63 | HR 65 | Resp 16 | Ht 68.0 in | Wt 158.0 lb

## 2020-01-11 DIAGNOSIS — I10 Essential (primary) hypertension: Secondary | ICD-10-CM

## 2020-01-11 DIAGNOSIS — E785 Hyperlipidemia, unspecified: Secondary | ICD-10-CM | POA: Diagnosis not present

## 2020-01-11 DIAGNOSIS — Z8709 Personal history of other diseases of the respiratory system: Secondary | ICD-10-CM | POA: Diagnosis not present

## 2020-01-11 DIAGNOSIS — I739 Peripheral vascular disease, unspecified: Secondary | ICD-10-CM | POA: Diagnosis not present

## 2020-01-11 NOTE — Progress Notes (Signed)
MRN : [id number]  Darren Sharp is a 74 y.o. ([birth year]) male who presents with chief complaint of No chief complaint on file. [name]  History of Present Illness:    The patient is seen for evaluation of painful lower extremities and diminished pulses. Patient notes the pain is occasionally associated with activity. He also notes bluish discoloration of his feet particularly as the day progresses. Recently he was treated for a right ankle cellulitis.  The patient denies rest pain or dangling of an extremity off the side of the bed during the night for relief.  No open wounds or sores at this time. No prior interventions or surgeries.  No history of back problems or DJD of the lumbar sacral spine.   The patient denies changes in claudication symptoms or new rest pain symptoms.    The patient's blood pressure has been stable and relatively well controlled. The patient denies amaurosis fugax or recent TIA symptoms. There are no recent neurological changes noted. The patient denies history of DVT, PE or superficial thrombophlebitis. The patient denies recent episodes of angina or shortness of breath.   No outpatient medications have been marked as taking for the 01/11/20 encounter (Appointment) with Delana Meyer, Dolores Lory, MD.    Past Medical History:  Diagnosis Date  . COPD (chronic obstructive pulmonary disease) ([place])   . COPD (chronic obstructive pulmonary disease) ([place])   . Diabetes mellitus without complication [facility])     Past Surgical History:  Procedure Laterality Date  . APPENDECTOMY    . COLONOSCOPY WITH PROPOFOL N/A 06/05/2018   Procedure: COLONOSCOPY WITH PROPOFOL;  Surgeon: Lollie Sails, MD;  Location: [facility] ENDOSCOPY;  Service: Endoscopy;  Laterality: N/A;  . ESOPHAGOGASTRODUODENOSCOPY (EGD) WITH PROPOFOL N/A 06/05/2018   Procedure: ESOPHAGOGASTRODUODENOSCOPY (EGD) WITH PROPOFOL;  Surgeon: Lollie Sails, MD;  Location: [facility] ENDOSCOPY;  Service: Endoscopy;   Laterality: N/A;  . EYE SURGERY    . HERNIA REPAIR    . KNEE CAPSULOTOMY Right     Social History Social History   Tobacco Use  . Smoking status: Former Smoker    Packs/day: 1.00    Years: 49.00    Pack years: 49.00    Types: Cigarettes    Quit date: 01/28/2010    Years since quitting: 9.9  . Smokeless tobacco: Never Used  Substance Use Topics  . Alcohol use: Yes    Alcohol/week: 1.0 standard drinks    Types: 1 Cans of beer per week    Comment: 4 or more times a week   . Drug use: Never    Family History Family History  Problem Relation Age of Onset  . Cancer Mother        lung  . Cancer Father        lung cancer   No family history of bleeding/clotting disorders, porphyria or autoimmune disease   Allergies  Allergen Reactions  . Penicillins Swelling     REVIEW OF SYSTEMS (Negative unless checked)  Constitutional: [] Weight loss  [] Fever  [] Chills Cardiac: [] Chest pain   [] Chest pressure   [] Palpitations   [] Shortness of breath when laying flat   [] Shortness of breath with exertion. Vascular:  [x] Pain in legs with walking   [] Pain in legs at rest  [] History of DVT   [] Phlebitis   [x] Swelling in legs   [] Varicose veins   [] Non-healing ulcers Pulmonary:   [] Uses home oxygen   [] Productive cough   [] Hemoptysis   [] Wheeze  [x] COPD   [] Asthma Neurologic:  []   Dizziness   [] Seizures   [] History of stroke   [] History of TIA  [] Aphasia   [] Vissual changes   [] Weakness or numbness in arm   [] Weakness or numbness in leg Musculoskeletal:   [] Joint swelling   [] Joint pain   [] Low back pain Hematologic:  [] Easy bruising  [] Easy bleeding   [] Hypercoagulable state   [] Anemic Gastrointestinal:  [] Diarrhea   [] Vomiting  [] Gastroesophageal reflux/heartburn   [] Difficulty swallowing. Genitourinary:  [] Chronic kidney disease   [] Difficult urination  [] Frequent urination   [] Blood in urine Skin:  [] Rashes   [] Ulcers  Psychological:  [] History of anxiety   []  History of major  depression.  Physical Examination  There were no vitals filed for this visit. There is no height or weight on file to calculate BMI. Gen: WD/WN, NAD Head: [place]/AT, No temporalis wasting.  Ear/Nose/Throat: Hearing grossly intact, nares w/o erythema or drainage, poor dentition Eyes: PER, EOMI, sclera nonicteric.  Neck: Supple, no masses.  No bruit or JVD.  Pulmonary:  Good air movement, clear to auscultation bilaterally, no use of accessory muscles.  Cardiac: RRR, normal S1, S2, no Murmurs. Vascular: There is ruddiness of the right ankle with mild to moderate venous stasis dermatitis noted. There is some cyanosis of the toes bilaterally. There is 2-3+ edema more so on the right than the left. Vessel Right Left  Radial Palpable Palpable  PT Trace Palpable Trace Palpable  DP 2+ Palpable 1+ Palpable  Gastrointestinal: soft, non-distended. No guarding/no peritoneal signs.  Musculoskeletal: M/S 5/5 throughout.  No deformity or atrophy.  Neurologic: CN 2-12 intact. Pain and light touch intact in extremities.  Symmetrical.  Speech is fluent. Motor exam as listed above. Psychiatric: Judgment intact, Mood & affect appropriate for pt's clinical situation. Dermatologic: No rashes or ulcers noted.  No changes consistent with cellulitis. Lymph : No Cervical lymphadenopathy, no lichenification or skin changes of chronic lymphedema.  CBC Lab Results  Component Value Date   WBC 5.7 02/04/2017   HGB 13.1 02/04/2017   HCT 38.9 02/04/2017   MCV 92 02/04/2017   PLT 253 02/04/2017    BMET    Component Value Date/Time   NA 143 02/04/2017 0922   K 4.6 02/04/2017 0922   CL 103 02/04/2017 0922   CO2 22 02/04/2017 0922   GLUCOSE 132 (H) 02/04/2017 0922   BUN 22 02/04/2017 0922   CREATININE 0.98 02/04/2017 0922   CALCIUM 9.1 02/04/2017 0922   GFRNONAA 78 02/04/2017 0922   GFRAA 91 02/04/2017 0922   CrCl cannot be calculated (Patient's most recent lab result is older than the maximum 21 days  allowed.).  COAG No results found for: INR, PROTIME  Radiology US Venous Img Lower Unilateral Right (DVT)  Result Date: 12/23/2019 CLINICAL DATA:  Right lower extremity edema pain, recent travel EXAM: RIGHT LOWER EXTREMITY VENOUS DOPPLER ULTRASOUND TECHNIQUE: Gray-scale sonography with graded compression, as well as color Doppler and duplex ultrasound were performed to evaluate the lower extremity deep venous systems from the level of the common femoral vein and including the common femoral, femoral, profunda femoral, popliteal and calf veins including the posterior tibial, peroneal and gastrocnemius veins when visible. The superficial great saphenous vein was also interrogated. Spectral Doppler was utilized to evaluate flow at rest and with distal augmentation maneuvers in the common femoral, femoral and popliteal veins. COMPARISON:  None. FINDINGS: Contralateral Common Femoral Vein: Respiratory phasicity is normal and symmetric with the symptomatic side. No evidence of thrombus. Normal compressibility. Common Femoral Vein: No evidence  of thrombus. Normal compressibility, respiratory phasicity and response to augmentation. Saphenofemoral Junction: No evidence of thrombus. Normal compressibility and flow on color Doppler imaging. Profunda Femoral Vein: No evidence of thrombus. Normal compressibility and flow on color Doppler imaging. Femoral Vein: No evidence of thrombus. Normal compressibility, respiratory phasicity and response to augmentation. Popliteal Vein: No evidence of thrombus. Normal compressibility, respiratory phasicity and response to augmentation. Calf Veins: No evidence of thrombus. Normal compressibility and flow on color Doppler imaging. IMPRESSION: No evidence of deep venous thrombosis. Electronically Signed   By: Jerilynn Mages.  Shick M.D.   On: 12/23/2019 13:26     Assessment/Plan 1. PAD (peripheral artery disease) (HCC) Recommend:  I do not find evidence of life style limiting vascular  disease. The patient specifically denies life style limitation.  Previous noninvasive studies including ABI's of the legs do not identify critical vascular problems.  The patient should continue walking and begin a more formal exercise program. The patient should continue his antiplatelet therapy and aggressive treatment of the lipid abnormalities.  The patient should begin wearing graduated compression socks 15-20 mmHg strength to control her mild edema.  Patient will follow-up with me on a PRN basis   2. Essential hypertension Continue antihypertensive medications as already ordered, these medications have been reviewed and there are no changes at this time.   3. Hyperlipidemia, unspecified hyperlipidemia type Continue statin as ordered and reviewed, no changes at this time   4. History of COPD Continue pulmonary medications and aerosols as already ordered, these medications have been reviewed and there are no changes at this time.      Hortencia Pilar, MD  01/11/2020 8:44 AM

## 2020-01-12 ENCOUNTER — Encounter (INDEPENDENT_AMBULATORY_CARE_PROVIDER_SITE_OTHER): Payer: Self-pay | Admitting: Vascular Surgery

## 2020-01-14 DIAGNOSIS — R0600 Dyspnea, unspecified: Secondary | ICD-10-CM | POA: Diagnosis not present

## 2020-03-07 DIAGNOSIS — E1169 Type 2 diabetes mellitus with other specified complication: Secondary | ICD-10-CM | POA: Diagnosis not present

## 2020-03-07 DIAGNOSIS — Z125 Encounter for screening for malignant neoplasm of prostate: Secondary | ICD-10-CM | POA: Diagnosis not present

## 2020-03-07 DIAGNOSIS — E782 Mixed hyperlipidemia: Secondary | ICD-10-CM | POA: Diagnosis not present

## 2020-03-14 DIAGNOSIS — E1169 Type 2 diabetes mellitus with other specified complication: Secondary | ICD-10-CM | POA: Diagnosis not present

## 2020-03-14 DIAGNOSIS — Z0000 Encounter for general adult medical examination without abnormal findings: Secondary | ICD-10-CM | POA: Diagnosis not present

## 2020-03-14 DIAGNOSIS — E782 Mixed hyperlipidemia: Secondary | ICD-10-CM | POA: Diagnosis not present

## 2020-06-13 DIAGNOSIS — E782 Mixed hyperlipidemia: Secondary | ICD-10-CM | POA: Diagnosis not present

## 2020-06-13 DIAGNOSIS — E1169 Type 2 diabetes mellitus with other specified complication: Secondary | ICD-10-CM | POA: Diagnosis not present

## 2020-06-20 DIAGNOSIS — E1169 Type 2 diabetes mellitus with other specified complication: Secondary | ICD-10-CM | POA: Diagnosis not present

## 2020-06-20 DIAGNOSIS — E782 Mixed hyperlipidemia: Secondary | ICD-10-CM | POA: Diagnosis not present

## 2020-06-20 DIAGNOSIS — Z23 Encounter for immunization: Secondary | ICD-10-CM | POA: Diagnosis not present

## 2020-06-20 DIAGNOSIS — J431 Panlobular emphysema: Secondary | ICD-10-CM | POA: Diagnosis not present

## 2020-07-23 ENCOUNTER — Telehealth: Payer: Self-pay | Admitting: *Deleted

## 2020-07-23 NOTE — Telephone Encounter (Signed)
Left a voicemail to inform patient that it is time to schedule his lung cancer screening CT scan. Instructed him to call back to update information and schedule scan.

## 2020-08-24 ENCOUNTER — Telehealth: Payer: Self-pay | Admitting: *Deleted

## 2020-08-24 DIAGNOSIS — Z122 Encounter for screening for malignant neoplasm of respiratory organs: Secondary | ICD-10-CM

## 2020-08-24 DIAGNOSIS — Z87891 Personal history of nicotine dependence: Secondary | ICD-10-CM

## 2020-08-24 NOTE — Telephone Encounter (Signed)
Contacted and scheduled. Former smoker, quit 01/28/10, 49 pack year

## 2020-08-29 ENCOUNTER — Ambulatory Visit
Admission: RE | Admit: 2020-08-29 | Discharge: 2020-08-29 | Disposition: A | Discharge status: Home / Self Care | Payer: PPO | Source: Ambulatory Visit | Attending: Oncology | Admitting: Oncology

## 2020-08-29 ENCOUNTER — Other Ambulatory Visit: Payer: Self-pay

## 2020-08-29 DIAGNOSIS — Z122 Encounter for screening for malignant neoplasm of respiratory organs: Secondary | ICD-10-CM | POA: Diagnosis not present

## 2020-08-29 DIAGNOSIS — Z87891 Personal history of nicotine dependence: Secondary | ICD-10-CM | POA: Diagnosis not present

## 2020-08-31 ENCOUNTER — Telehealth: Payer: Self-pay | Admitting: *Deleted

## 2020-08-31 NOTE — Telephone Encounter (Signed)
Notified patient of LDCT lung cancer screening program results with recommendation for 6 month follow up imaging. Also notified of incidental findings noted below and is encouraged to discuss further with PCP who will receive a copy of this note and/or the CT report. Patient verbalizes understanding.   IMPRESSION: 1. Lung-RADS 3, probably benign findings. New indistinct 4.9 mm superior segment right lower lobe pulmonary nodule, favor inflammatory. Short-term follow-up in 6 months is recommended with repeat low-dose chest CT without contrast (please use the following order, "CT CHEST LCS NODULE FOLLOW-UP W/O CM"). 2. Three-vessel coronary atherosclerosis. 3. Cholelithiasis. 4. Aortic Atherosclerosis (ICD10-I70.0) and Emphysema (ICD10-J43.9).

## 2020-10-17 DIAGNOSIS — E1169 Type 2 diabetes mellitus with other specified complication: Secondary | ICD-10-CM | POA: Diagnosis not present

## 2020-10-17 DIAGNOSIS — E782 Mixed hyperlipidemia: Secondary | ICD-10-CM | POA: Diagnosis not present

## 2020-10-24 DIAGNOSIS — E1169 Type 2 diabetes mellitus with other specified complication: Secondary | ICD-10-CM | POA: Diagnosis not present

## 2020-10-24 DIAGNOSIS — Z23 Encounter for immunization: Secondary | ICD-10-CM | POA: Diagnosis not present

## 2020-10-24 DIAGNOSIS — Z125 Encounter for screening for malignant neoplasm of prostate: Secondary | ICD-10-CM | POA: Diagnosis not present

## 2020-10-24 DIAGNOSIS — R911 Solitary pulmonary nodule: Secondary | ICD-10-CM | POA: Diagnosis not present

## 2020-10-24 DIAGNOSIS — E782 Mixed hyperlipidemia: Secondary | ICD-10-CM | POA: Diagnosis not present

## 2020-10-27 ENCOUNTER — Other Ambulatory Visit: Payer: Self-pay | Admitting: Internal Medicine

## 2020-10-27 DIAGNOSIS — R911 Solitary pulmonary nodule: Secondary | ICD-10-CM

## 2020-10-27 DIAGNOSIS — E1169 Type 2 diabetes mellitus with other specified complication: Secondary | ICD-10-CM

## 2020-10-27 DIAGNOSIS — E782 Mixed hyperlipidemia: Secondary | ICD-10-CM

## 2020-12-19 DIAGNOSIS — E119 Type 2 diabetes mellitus without complications: Secondary | ICD-10-CM | POA: Diagnosis not present

## 2021-02-03 ENCOUNTER — Telehealth: Payer: Self-pay

## 2021-02-20 ENCOUNTER — Other Ambulatory Visit: Payer: Self-pay

## 2021-02-20 ENCOUNTER — Ambulatory Visit
Admission: RE | Admit: 2021-02-20 | Discharge: 2021-02-20 | Disposition: A | Discharge status: Home / Self Care | Payer: HMO | Source: Ambulatory Visit | Attending: Internal Medicine | Admitting: Internal Medicine

## 2021-02-20 DIAGNOSIS — I700 Atherosclerosis of aorta: Secondary | ICD-10-CM | POA: Diagnosis not present

## 2021-02-20 DIAGNOSIS — M47814 Spondylosis without myelopathy or radiculopathy, thoracic region: Secondary | ICD-10-CM | POA: Diagnosis not present

## 2021-02-20 DIAGNOSIS — E1169 Type 2 diabetes mellitus with other specified complication: Secondary | ICD-10-CM | POA: Insufficient documentation

## 2021-02-20 DIAGNOSIS — R911 Solitary pulmonary nodule: Secondary | ICD-10-CM | POA: Insufficient documentation

## 2021-02-20 DIAGNOSIS — J432 Centrilobular emphysema: Secondary | ICD-10-CM | POA: Diagnosis not present

## 2021-02-20 DIAGNOSIS — I2510 Atherosclerotic heart disease of native coronary artery without angina pectoris: Secondary | ICD-10-CM | POA: Diagnosis not present

## 2021-02-20 DIAGNOSIS — E782 Mixed hyperlipidemia: Secondary | ICD-10-CM | POA: Diagnosis not present

## 2021-02-20 HISTORY — DX: Essential (primary) hypertension: I10

## 2021-02-20 LAB — POCT I-STAT CREATININE: Creatinine, Ser: 1 mg/dL (ref 0.61–1.24)

## 2021-02-20 MED ORDER — IOHEXOL 300 MG/ML  SOLN
75.0000 mL | Freq: Once | INTRAMUSCULAR | Status: AC | PRN
  Administered 2021-02-20: 75 mL via INTRAVENOUS

## 2021-02-27 DIAGNOSIS — J431 Panlobular emphysema: Secondary | ICD-10-CM | POA: Diagnosis not present

## 2021-02-27 DIAGNOSIS — E1169 Type 2 diabetes mellitus with other specified complication: Secondary | ICD-10-CM | POA: Diagnosis not present

## 2021-02-27 DIAGNOSIS — Z1331 Encounter for screening for depression: Secondary | ICD-10-CM | POA: Diagnosis not present

## 2021-02-27 DIAGNOSIS — E782 Mixed hyperlipidemia: Secondary | ICD-10-CM | POA: Diagnosis not present

## 2021-02-27 DIAGNOSIS — R9389 Abnormal findings on diagnostic imaging of other specified body structures: Secondary | ICD-10-CM | POA: Diagnosis not present

## 2021-03-20 DIAGNOSIS — Z125 Encounter for screening for malignant neoplasm of prostate: Secondary | ICD-10-CM | POA: Diagnosis not present

## 2021-03-20 DIAGNOSIS — E782 Mixed hyperlipidemia: Secondary | ICD-10-CM | POA: Diagnosis not present

## 2021-03-20 DIAGNOSIS — E1169 Type 2 diabetes mellitus with other specified complication: Secondary | ICD-10-CM | POA: Diagnosis not present

## 2021-03-27 DIAGNOSIS — E1169 Type 2 diabetes mellitus with other specified complication: Secondary | ICD-10-CM | POA: Diagnosis not present

## 2021-03-27 DIAGNOSIS — I700 Atherosclerosis of aorta: Secondary | ICD-10-CM | POA: Diagnosis not present

## 2021-03-27 DIAGNOSIS — J431 Panlobular emphysema: Secondary | ICD-10-CM | POA: Diagnosis not present

## 2021-03-27 DIAGNOSIS — Z0000 Encounter for general adult medical examination without abnormal findings: Secondary | ICD-10-CM | POA: Diagnosis not present

## 2021-03-27 DIAGNOSIS — R9389 Abnormal findings on diagnostic imaging of other specified body structures: Secondary | ICD-10-CM | POA: Diagnosis not present

## 2021-03-27 DIAGNOSIS — E782 Mixed hyperlipidemia: Secondary | ICD-10-CM | POA: Diagnosis not present

## 2021-03-27 DIAGNOSIS — D500 Iron deficiency anemia secondary to blood loss (chronic): Secondary | ICD-10-CM | POA: Diagnosis not present

## 2021-05-01 DIAGNOSIS — D500 Iron deficiency anemia secondary to blood loss (chronic): Secondary | ICD-10-CM | POA: Diagnosis not present

## 2021-06-08 DIAGNOSIS — E1165 Type 2 diabetes mellitus with hyperglycemia: Secondary | ICD-10-CM | POA: Diagnosis not present

## 2021-06-08 DIAGNOSIS — R5383 Other fatigue: Secondary | ICD-10-CM | POA: Diagnosis not present

## 2021-07-03 DIAGNOSIS — D500 Iron deficiency anemia secondary to blood loss (chronic): Secondary | ICD-10-CM | POA: Diagnosis not present

## 2021-09-25 DIAGNOSIS — D500 Iron deficiency anemia secondary to blood loss (chronic): Secondary | ICD-10-CM | POA: Diagnosis not present

## 2021-09-25 DIAGNOSIS — E1169 Type 2 diabetes mellitus with other specified complication: Secondary | ICD-10-CM | POA: Diagnosis not present

## 2021-09-25 DIAGNOSIS — E782 Mixed hyperlipidemia: Secondary | ICD-10-CM | POA: Diagnosis not present

## 2021-10-02 DIAGNOSIS — Z125 Encounter for screening for malignant neoplasm of prostate: Secondary | ICD-10-CM | POA: Diagnosis not present

## 2021-10-02 DIAGNOSIS — E538 Deficiency of other specified B group vitamins: Secondary | ICD-10-CM | POA: Diagnosis not present

## 2021-10-02 DIAGNOSIS — E1169 Type 2 diabetes mellitus with other specified complication: Secondary | ICD-10-CM | POA: Diagnosis not present

## 2021-10-02 DIAGNOSIS — E782 Mixed hyperlipidemia: Secondary | ICD-10-CM | POA: Diagnosis not present

## 2021-12-08 NOTE — Telephone Encounter (Signed)
Signing encounter, see previous note 02/03/21 °

## 2022-01-19 ENCOUNTER — Other Ambulatory Visit: Payer: Self-pay | Admitting: Neurology

## 2022-01-19 DIAGNOSIS — E782 Mixed hyperlipidemia: Secondary | ICD-10-CM | POA: Diagnosis not present

## 2022-01-19 DIAGNOSIS — R259 Unspecified abnormal involuntary movements: Secondary | ICD-10-CM | POA: Diagnosis not present

## 2022-01-19 DIAGNOSIS — M542 Cervicalgia: Secondary | ICD-10-CM

## 2022-01-19 DIAGNOSIS — R42 Dizziness and giddiness: Secondary | ICD-10-CM | POA: Diagnosis not present

## 2022-01-19 DIAGNOSIS — E1169 Type 2 diabetes mellitus with other specified complication: Secondary | ICD-10-CM | POA: Diagnosis not present

## 2022-01-19 DIAGNOSIS — H53411 Scotoma involving central area, right eye: Secondary | ICD-10-CM | POA: Diagnosis not present

## 2022-01-24 DIAGNOSIS — H31003 Unspecified chorioretinal scars, bilateral: Secondary | ICD-10-CM | POA: Diagnosis not present

## 2022-01-28 ENCOUNTER — Ambulatory Visit
Admission: RE | Admit: 2022-01-28 | Discharge: 2022-01-28 | Disposition: A | Discharge status: Home / Self Care | Payer: HMO | Source: Ambulatory Visit | Attending: Neurology | Admitting: Neurology

## 2022-01-28 ENCOUNTER — Other Ambulatory Visit: Payer: Self-pay

## 2022-01-28 DIAGNOSIS — M542 Cervicalgia: Secondary | ICD-10-CM | POA: Insufficient documentation

## 2022-01-28 DIAGNOSIS — M50221 Other cervical disc displacement at C4-C5 level: Secondary | ICD-10-CM | POA: Diagnosis not present

## 2022-01-28 DIAGNOSIS — R259 Unspecified abnormal involuntary movements: Secondary | ICD-10-CM | POA: Diagnosis not present

## 2022-01-28 DIAGNOSIS — R42 Dizziness and giddiness: Secondary | ICD-10-CM | POA: Diagnosis not present

## 2022-01-28 DIAGNOSIS — M4802 Spinal stenosis, cervical region: Secondary | ICD-10-CM | POA: Diagnosis not present

## 2022-01-31 ENCOUNTER — Ambulatory Visit: Payer: HMO

## 2022-01-31 ENCOUNTER — Other Ambulatory Visit: Payer: HMO

## 2022-02-01 DIAGNOSIS — H53461 Homonymous bilateral field defects, right side: Secondary | ICD-10-CM | POA: Diagnosis not present

## 2022-02-02 DIAGNOSIS — H53411 Scotoma involving central area, right eye: Secondary | ICD-10-CM | POA: Diagnosis not present

## 2022-02-02 DIAGNOSIS — G9520 Unspecified cord compression: Secondary | ICD-10-CM | POA: Diagnosis not present

## 2022-02-02 DIAGNOSIS — F439 Reaction to severe stress, unspecified: Secondary | ICD-10-CM | POA: Diagnosis not present

## 2022-02-02 DIAGNOSIS — G20 Parkinson's disease: Secondary | ICD-10-CM | POA: Diagnosis not present

## 2022-02-02 DIAGNOSIS — G939 Disorder of brain, unspecified: Secondary | ICD-10-CM | POA: Diagnosis not present

## 2022-02-19 DIAGNOSIS — Z0000 Encounter for general adult medical examination without abnormal findings: Secondary | ICD-10-CM | POA: Diagnosis not present

## 2022-02-19 DIAGNOSIS — G20 Parkinson's disease: Secondary | ICD-10-CM | POA: Diagnosis present

## 2022-02-19 DIAGNOSIS — E782 Mixed hyperlipidemia: Secondary | ICD-10-CM | POA: Diagnosis not present

## 2022-02-19 DIAGNOSIS — M5090 Cervical disc disorder, unspecified, unspecified cervical region: Secondary | ICD-10-CM | POA: Diagnosis not present

## 2022-02-19 DIAGNOSIS — E1169 Type 2 diabetes mellitus with other specified complication: Secondary | ICD-10-CM | POA: Diagnosis not present

## 2022-02-19 DIAGNOSIS — F330 Major depressive disorder, recurrent, mild: Secondary | ICD-10-CM | POA: Insufficient documentation

## 2022-02-19 DIAGNOSIS — I69398 Other sequelae of cerebral infarction: Secondary | ICD-10-CM

## 2022-02-19 DIAGNOSIS — Z1389 Encounter for screening for other disorder: Secondary | ICD-10-CM | POA: Diagnosis not present

## 2022-02-21 ENCOUNTER — Other Ambulatory Visit: Payer: Self-pay

## 2022-02-21 ENCOUNTER — Emergency Department: Payer: HMO

## 2022-02-21 ENCOUNTER — Inpatient Hospital Stay: Payer: HMO

## 2022-02-21 ENCOUNTER — Inpatient Hospital Stay
Admission: EM | Admit: 2022-02-21 | Discharge: 2022-02-24 | DRG: 864 | Disposition: A | Discharge status: Home / Self Care | Payer: HMO | Attending: Internal Medicine | Admitting: Internal Medicine

## 2022-02-21 DIAGNOSIS — N179 Acute kidney failure, unspecified: Secondary | ICD-10-CM | POA: Diagnosis present

## 2022-02-21 DIAGNOSIS — Z87891 Personal history of nicotine dependence: Secondary | ICD-10-CM

## 2022-02-21 DIAGNOSIS — R0689 Other abnormalities of breathing: Secondary | ICD-10-CM | POA: Diagnosis not present

## 2022-02-21 DIAGNOSIS — E1165 Type 2 diabetes mellitus with hyperglycemia: Secondary | ICD-10-CM | POA: Diagnosis present

## 2022-02-21 DIAGNOSIS — Z66 Do not resuscitate: Secondary | ICD-10-CM | POA: Diagnosis not present

## 2022-02-21 DIAGNOSIS — Z880 Allergy status to penicillin: Secondary | ICD-10-CM | POA: Diagnosis not present

## 2022-02-21 DIAGNOSIS — M47814 Spondylosis without myelopathy or radiculopathy, thoracic region: Secondary | ICD-10-CM | POA: Diagnosis not present

## 2022-02-21 DIAGNOSIS — I69398 Other sequelae of cerebral infarction: Secondary | ICD-10-CM | POA: Diagnosis not present

## 2022-02-21 DIAGNOSIS — I10 Essential (primary) hypertension: Secondary | ICD-10-CM | POA: Diagnosis present

## 2022-02-21 DIAGNOSIS — I959 Hypotension, unspecified: Secondary | ICD-10-CM | POA: Diagnosis not present

## 2022-02-21 DIAGNOSIS — R509 Fever, unspecified: Secondary | ICD-10-CM | POA: Diagnosis not present

## 2022-02-21 DIAGNOSIS — F32A Depression, unspecified: Secondary | ICD-10-CM | POA: Diagnosis present

## 2022-02-21 DIAGNOSIS — R001 Bradycardia, unspecified: Secondary | ICD-10-CM | POA: Diagnosis not present

## 2022-02-21 DIAGNOSIS — E782 Mixed hyperlipidemia: Secondary | ICD-10-CM | POA: Diagnosis present

## 2022-02-21 DIAGNOSIS — R911 Solitary pulmonary nodule: Secondary | ICD-10-CM | POA: Diagnosis not present

## 2022-02-21 DIAGNOSIS — Z801 Family history of malignant neoplasm of trachea, bronchus and lung: Secondary | ICD-10-CM

## 2022-02-21 DIAGNOSIS — Z79899 Other long term (current) drug therapy: Secondary | ICD-10-CM

## 2022-02-21 DIAGNOSIS — G20 Parkinson's disease: Secondary | ICD-10-CM | POA: Diagnosis not present

## 2022-02-21 DIAGNOSIS — K8020 Calculus of gallbladder without cholecystitis without obstruction: Secondary | ICD-10-CM | POA: Diagnosis present

## 2022-02-21 DIAGNOSIS — Z7982 Long term (current) use of aspirin: Secondary | ICD-10-CM

## 2022-02-21 DIAGNOSIS — Z7984 Long term (current) use of oral hypoglycemic drugs: Secondary | ICD-10-CM

## 2022-02-21 DIAGNOSIS — E1169 Type 2 diabetes mellitus with other specified complication: Secondary | ICD-10-CM | POA: Diagnosis present

## 2022-02-21 DIAGNOSIS — H539 Unspecified visual disturbance: Secondary | ICD-10-CM | POA: Diagnosis not present

## 2022-02-21 DIAGNOSIS — R42 Dizziness and giddiness: Secondary | ICD-10-CM | POA: Diagnosis not present

## 2022-02-21 DIAGNOSIS — R000 Tachycardia, unspecified: Secondary | ICD-10-CM | POA: Diagnosis not present

## 2022-02-21 DIAGNOSIS — Z20822 Contact with and (suspected) exposure to covid-19: Secondary | ICD-10-CM | POA: Diagnosis present

## 2022-02-21 DIAGNOSIS — R531 Weakness: Principal | ICD-10-CM

## 2022-02-21 DIAGNOSIS — R251 Tremor, unspecified: Secondary | ICD-10-CM | POA: Diagnosis not present

## 2022-02-21 DIAGNOSIS — R0902 Hypoxemia: Secondary | ICD-10-CM | POA: Diagnosis present

## 2022-02-21 DIAGNOSIS — J449 Chronic obstructive pulmonary disease, unspecified: Secondary | ICD-10-CM | POA: Diagnosis present

## 2022-02-21 DIAGNOSIS — Z0389 Encounter for observation for other suspected diseases and conditions ruled out: Secondary | ICD-10-CM | POA: Diagnosis not present

## 2022-02-21 LAB — URINALYSIS, COMPLETE (UACMP) WITH MICROSCOPIC
Bilirubin Urine: NEGATIVE
Glucose, UA: NEGATIVE mg/dL
Ketones, ur: 5 mg/dL — AB
Leukocytes,Ua: NEGATIVE
Nitrite: NEGATIVE
Protein, ur: 30 mg/dL — AB
Specific Gravity, Urine: 1.018 (ref 1.005–1.030)
pH: 5 (ref 5.0–8.0)

## 2022-02-21 LAB — COMPREHENSIVE METABOLIC PANEL
ALT: 57 U/L — ABNORMAL HIGH (ref 0–44)
AST: 91 U/L — ABNORMAL HIGH (ref 15–41)
Albumin: 3.9 g/dL (ref 3.5–5.0)
Alkaline Phosphatase: 59 U/L (ref 38–126)
Anion gap: 12 (ref 5–15)
BUN: 31 mg/dL — ABNORMAL HIGH (ref 8–23)
CO2: 25 mmol/L (ref 22–32)
Calcium: 9.6 mg/dL (ref 8.9–10.3)
Chloride: 98 mmol/L (ref 98–111)
Creatinine, Ser: 1.5 mg/dL — ABNORMAL HIGH (ref 0.61–1.24)
GFR, Estimated: 49 mL/min — ABNORMAL LOW (ref >60)
Glucose, Bld: 192 mg/dL — ABNORMAL HIGH (ref 70–99)
Potassium: 4 mmol/L (ref 3.5–5.1)
Sodium: 135 mmol/L (ref 135–145)
Total Bilirubin: 1 mg/dL (ref 0.3–1.2)
Total Protein: 7.2 g/dL (ref 6.5–8.1)

## 2022-02-21 LAB — LACTIC ACID, PLASMA
Lactic Acid, Venous: 2 mmol/L (ref 0.5–1.9)
Lactic Acid, Venous: 1.7 mmol/L (ref 0.5–1.9)

## 2022-02-21 LAB — URINE DRUG SCREEN, QUALITATIVE (ARMC ONLY)
Amphetamines, Ur Screen: NOT DETECTED
Barbiturates, Ur Screen: NOT DETECTED
Benzodiazepine, Ur Scrn: POSITIVE — AB
Cannabinoid 50 Ng, Ur ~~LOC~~: NOT DETECTED
Cocaine Metabolite,Ur ~~LOC~~: NOT DETECTED
MDMA (Ecstasy)Ur Screen: NOT DETECTED
Methadone Scn, Ur: NOT DETECTED
Opiate, Ur Screen: NOT DETECTED
Phencyclidine (PCP) Ur S: NOT DETECTED
Tricyclic, Ur Screen: NOT DETECTED

## 2022-02-21 LAB — CBC WITH DIFFERENTIAL/PLATELET
Abs Immature Granulocytes: 0.06 10*3/uL (ref 0.00–0.07)
Basophils Absolute: 0 10*3/uL (ref 0.0–0.1)
Basophils Relative: 0 %
Eosinophils Absolute: 0 10*3/uL (ref 0.0–0.5)
Eosinophils Relative: 0 %
HCT: 35.1 % — ABNORMAL LOW (ref 39.0–52.0)
Hemoglobin: 11.9 g/dL — ABNORMAL LOW (ref 13.0–17.0)
Immature Granulocytes: 1 %
Lymphocytes Relative: 7 %
Lymphs Abs: 0.6 10*3/uL — ABNORMAL LOW (ref 0.7–4.0)
MCH: 30.9 pg (ref 26.0–34.0)
MCHC: 33.9 g/dL (ref 30.0–36.0)
MCV: 91.2 fL (ref 80.0–100.0)
Monocytes Absolute: 0.5 10*3/uL (ref 0.1–1.0)
Monocytes Relative: 6 %
Neutro Abs: 6.8 10*3/uL (ref 1.7–7.7)
Neutrophils Relative %: 86 %
Platelets: 175 10*3/uL (ref 150–400)
RBC: 3.85 MIL/uL — ABNORMAL LOW (ref 4.22–5.81)
RDW: 12.9 % (ref 11.5–15.5)
WBC: 7.9 10*3/uL (ref 4.0–10.5)
nRBC: 0 % (ref 0.0–0.2)

## 2022-02-21 LAB — RESP PANEL BY RT-PCR (FLU A&B, COVID) ARPGX2
Influenza A by PCR: NEGATIVE
Influenza B by PCR: NEGATIVE
SARS Coronavirus 2 by RT PCR: NEGATIVE

## 2022-02-21 LAB — APTT: aPTT: 30 seconds (ref 24–36)

## 2022-02-21 LAB — PROTIME-INR
INR: 1.1 (ref 0.8–1.2)
Prothrombin Time: 13.9 seconds (ref 11.4–15.2)

## 2022-02-21 LAB — CK: Total CK: 942 U/L — ABNORMAL HIGH (ref 49–397)

## 2022-02-21 LAB — GAMMA GT: GGT: 25 U/L (ref 7–50)

## 2022-02-21 MED ORDER — ROSUVASTATIN CALCIUM 10 MG PO TABS
10.0000 mg | ORAL_TABLET | ORAL | Status: DC
Start: 2022-02-23 — End: 2022-02-21

## 2022-02-21 MED ORDER — SODIUM CHLORIDE 0.9 % IV BOLUS (SEPSIS)
1000.0000 mL | Freq: Once | INTRAVENOUS | Status: AC
  Administered 2022-02-21: 1000 mL via INTRAVENOUS

## 2022-02-21 MED ORDER — VANCOMYCIN HCL IN DEXTROSE 1-5 GM/200ML-% IV SOLN: 1000.0000 mg | INTRAVENOUS | Status: DC

## 2022-02-21 MED ORDER — METRONIDAZOLE 500 MG/100ML IV SOLN
500.0000 mg | Freq: Once | INTRAVENOUS | Status: AC
  Administered 2022-02-21: 500 mg via INTRAVENOUS
  Filled 2022-02-21: qty 100

## 2022-02-21 MED ORDER — VITAMIN E 45 MG (100 UNIT) PO CAPS
400.0000 [IU] | ORAL_CAPSULE | Freq: Every day | ORAL | Status: DC
  Administered 2022-02-22 – 2022-02-24 (×3): 400 [IU] via ORAL
  Filled 2022-02-21 (×3): qty 4

## 2022-02-21 MED ORDER — CARBIDOPA-LEVODOPA 25-100 MG PO TABS
1.0000 | ORAL_TABLET | Freq: Three times a day (TID) | ORAL | Status: DC
  Administered 2022-02-21 – 2022-02-24 (×10): 1 via ORAL
  Filled 2022-02-21 (×11): qty 1

## 2022-02-21 MED ORDER — N-ACETYL-L-CYSTEINE 600 MG PO CAPS: 1000.0000 mg | ORAL_CAPSULE | Freq: Two times a day (BID) | ORAL | Status: DC

## 2022-02-21 MED ORDER — UMECLIDINIUM-VILANTEROL 62.5-25 MCG/ACT IN AEPB
1.0000 | INHALATION_SPRAY | Freq: Every day | RESPIRATORY_TRACT | Status: DC
  Administered 2022-02-21 – 2022-02-24 (×4): 1 via RESPIRATORY_TRACT
  Filled 2022-02-21: qty 14

## 2022-02-21 MED ORDER — IBUPROFEN 400 MG PO TABS
400.0000 mg | ORAL_TABLET | Freq: Four times a day (QID) | ORAL | Status: DC | PRN
  Administered 2022-02-21 – 2022-02-23 (×8): 400 mg via ORAL
  Filled 2022-02-21 (×8): qty 1

## 2022-02-21 MED ORDER — LOSARTAN POTASSIUM 50 MG PO TABS
50.0000 mg | ORAL_TABLET | Freq: Every day | ORAL | Status: DC
  Administered 2022-02-22: 50 mg via ORAL
  Filled 2022-02-21: qty 1

## 2022-02-21 MED ORDER — IOHEXOL 350 MG/ML SOLN
75.0000 mL | Freq: Once | INTRAVENOUS | Status: AC | PRN
  Administered 2022-02-21: 75 mL via INTRAVENOUS

## 2022-02-21 MED ORDER — METRONIDAZOLE 500 MG/100ML IV SOLN: 500.0000 mg | Freq: Two times a day (BID) | INTRAVENOUS | Status: DC

## 2022-02-21 MED ORDER — SODIUM CHLORIDE 0.9 % IV SOLN
2.0000 g | Freq: Three times a day (TID) | INTRAVENOUS | Status: DC
  Administered 2022-02-21 – 2022-02-23 (×5): 2 g via INTRAVENOUS
  Filled 2022-02-21: qty 10
  Filled 2022-02-21: qty 2
  Filled 2022-02-21 (×3): qty 10
  Filled 2022-02-21: qty 2
  Filled 2022-02-21 (×2): qty 10

## 2022-02-21 MED ORDER — CARBIDOPA-LEVODOPA 25-100 MG PO TABS: 1.0000 | ORAL_TABLET | Freq: Three times a day (TID) | ORAL | Status: DC

## 2022-02-21 MED ORDER — IOHEXOL 9 MG/ML PO SOLN: 500.0000 mL | ORAL | Status: AC

## 2022-02-21 MED ORDER — ASPIRIN EC 81 MG PO TBEC: 81.0000 mg | DELAYED_RELEASE_TABLET | Freq: Every day | ORAL | Status: DC

## 2022-02-21 MED ORDER — SODIUM CHLORIDE 0.9 % IV SOLN
2.0000 g | Freq: Once | INTRAVENOUS | Status: AC
  Administered 2022-02-21: 2 g via INTRAVENOUS
  Filled 2022-02-21: qty 10

## 2022-02-21 MED ORDER — HYDROCHLOROTHIAZIDE 12.5 MG PO TABS
12.5000 mg | ORAL_TABLET | Freq: Every day | ORAL | Status: DC
  Administered 2022-02-22: 12.5 mg via ORAL
  Filled 2022-02-21: qty 1

## 2022-02-21 MED ORDER — OVER THE COUNTER MEDICATION: 1.0000 | Freq: Every day | Status: DC

## 2022-02-21 MED ORDER — ENOXAPARIN SODIUM 40 MG/0.4ML IJ SOSY
40.0000 mg | PREFILLED_SYRINGE | INTRAMUSCULAR | Status: DC
  Administered 2022-02-23: 40 mg via SUBCUTANEOUS
  Filled 2022-02-21: qty 0.4

## 2022-02-21 MED ORDER — ADULT MULTIVITAMIN W/MINERALS CH: 1.0000 | ORAL_TABLET | Freq: Every day | ORAL | Status: DC

## 2022-02-21 MED ORDER — ENOXAPARIN SODIUM 40 MG/0.4ML IJ SOSY: 40.0000 mg | PREFILLED_SYRINGE | INTRAMUSCULAR | Status: DC

## 2022-02-21 MED ORDER — VANCOMYCIN HCL 1500 MG/300ML IV SOLN
1500.0000 mg | Freq: Once | INTRAVENOUS | Status: AC
  Administered 2022-02-21: 1500 mg via INTRAVENOUS
  Filled 2022-02-21 (×2): qty 300

## 2022-02-21 MED ORDER — VANCOMYCIN HCL IN DEXTROSE 1-5 GM/200ML-% IV SOLN: 1000.0000 mg | Freq: Once | INTRAVENOUS | Status: DC

## 2022-02-21 MED ORDER — OXYCODONE HCL 5 MG PO TABS: 5.0000 mg | ORAL_TABLET | ORAL | Status: DC | PRN

## 2022-02-21 NOTE — H&P (Addendum)
? ? ?HISTORY AND PHYSICAL ? ?Patient: Darren Sharp 76 y.o. male ?MRN: [id number] ? ?Today is hospital day 0 after presenting to ED on 02/21/2022  9:36 AM with  ?Chief Complaint  ?Patient presents with  ? Weakness  ? ? ? ?RECORD REVIEW AND HOSPITAL COURSE: ?Per ED notes 02/21/22: "Darren Sharp is a 76 y.o. male with a past medical history of COPD, diabetes, hypertension, recent diagnosis of Parkinson's disease 1 month ago presents to the emergency department for generalized fatigue and weakness.  According to the patient over the past 3 days he has been feeling very weak and fatigued.  Wife states she measured a temperature last night of 102.  Patient denies any dysuria but wife states the urine yesterday looked almost orange in appearance.  Patient does state he has had slight cough and congestion.  No vomiting or diarrhea." ?Recent records reviewed, patient has been following with neurology for recent diagnosis of Parkinson's.  Started on Sinemet which he takes 3 times daily.  Also some concern for depression related to recent Parkinson's diagnosis, notes she recent prescription for sertraline 50 mg but patient states that he has not started this medication ?ED course 02/21/22: Afebrile, vital signs relatively stable, diastolic blood pressure a bit on the low side.  Patient had an episode of hypoxia and increased respiratory rate but CT chest was negative for PE or other concerns such as pneumonia.  Patient had taken Tylenol at home prior to coming to ED, did not have another fever until approximately 5:30 PM at which point was 102.1.  White blood cells normal, hemoglobin stable 11.9, urinalysis showed small ketones and protein.  MRI brain showed no concerns.  CT abdomen/pelvis pending at time of admission, ordered by hospitalist.  In ED, patient received 1 L normal saline, broad-spectrum antibiotics with vancomycin, metronidazole, aztreonam. ? ? ?Consultants:  ?Neurology  ? ? ? ?SUBJECTIVE:  ?Patient seen and  examined in emergency department.  On initial exam, he was resting comfortably prior to being taken down for MRI brain.  On revisiting him to go over MRI results, which were essentially negative, patient is shaking entire body in bed, he is speaking in complete sentences with normal speech, he is maintaining eye contact, he states that he gets these chills, is feeling very cold now, and these tremors/shakes whenever his Sinemet is wearing off. ? ? ? ? ?ASSESSMENT & PLAN ? ?Fever of unknown origin (FUO), generalized weakness ?Fever at home reported to be 102, Tylenol administered prior to presentation to the ED, afebrile until this evening (approx 17:30 02/21/22) at which point up to 102.1] ?Lactate initially 2.0, trending down to 1.7, continue to follow  ?Normal WBC ?No concerns on chest x-ray ?No concerns on CT chest ?Urinalysis no apparent UTI ?No abdominal pain ?No headache, no rash, meningitis seems very unlikely but may consider LP per neurology ?Urine cultures and blood cultures pending ?MRI brain no concerns ?Continue to monitor VS ?Continue antibiotics for now, hopefully can deescalate/discontinue  ?CT abdomen/pelvis pending, if no intraabdominal process can probably d/c Flagyl at least  ? ?ADDENDUM 02/21/22 7:39 PM spoke w/ neurology, Dr. Curly Shores. Concern for possible neuroleptic malignant syndrome or other dysautonomia, SEE NOTE FROM DR Curly Shores 02/21/22  ?-holding lovenox in case needs LP ?-consult to pharmacy to review meds for anything else that might exacerbate neuromuscular problems  ?-labs added: CK, GGT ?-meds held: ASA, statin, lovenox (SCD ordered for VTE ppx)  ? ? ? ?Parkinson's disease ([place]) ?Continue sinemet  ?  Neuro consult placed - appreciate recommendations re: whether his parkinson's can explain any of his symptoms, if another neuro cause might be present, if he should have LP to evaluate fever, if need to adjust medications (patient exhibits significant tremors/body shaking, almost myoclonus,  when his sinemet is wearing off, which is not consistent w/ severity of relatively new diagnosis)  ? ?DM type 2 with diabetic mixed hyperlipidemia ([place]) ?Hold home meds given mild increase creatinine  ?SSI while inpatient  ? ?Hypertension ?Continue home medications  ? ?Visual disturbance as complication of stroke ?No new symptoms, MRI brain 02/21/22 no new concerns ?Continue ASA and statin based on last neurology note  ? ? ?VTE Ppx: Lovenox ?CODE STATUS: DNR ?Admitted from: home ?Expected Dispo: home possible w/ HH, vs SNF ?Barriers to discharge: continued medical workup  ?Family communication:  ? ? ? ? ? ? ? ? ? ? ? ? ? ?Past Medical History:  ?Diagnosis Date  ? COPD (chronic obstructive pulmonary disease) ([place])   ? COPD (chronic obstructive pulmonary disease) ([place])   ? Diabetes mellitus without complication ([place])   ? Hypertension   ? ? ?Past Surgical History:  ?Procedure Laterality Date  ? APPENDECTOMY    ? COLONOSCOPY WITH PROPOFOL N/A 06/05/2018  ? Procedure: COLONOSCOPY WITH PROPOFOL;  Surgeon: Lollie Sails, MD;  [facility];  Service: Endoscopy;  Laterality: N/A;  ? ESOPHAGOGASTRODUODENOSCOPY (EGD) WITH PROPOFOL N/A 06/05/2018  ? Procedure: ESOPHAGOGASTRODUODENOSCOPY (EGD) WITH PROPOFOL;  Surgeon: Lollie Sails, MD;  Location: [facility] ENDOSCOPY;  Service: Endoscopy;  Laterality: N/A;  ? EYE SURGERY    ? HERNIA REPAIR    ? KNEE CAPSULOTOMY Right   ? ? ?Family History  ?Problem Relation Age of Onset  ? Cancer Mother   ?     lung  ? Cancer Father   ?     lung cancer   ? ?Social History:  reports that he quit smoking about 12 years ago. His smoking use included cigarettes. He has a 49.00 pack-year smoking history. He has never used smokeless tobacco. He reports current alcohol use of about 1.0 standard drink per week. He reports that he does not use drugs. ? ?Allergies:  ?Allergies  ?Allergen Reactions  ? Penicillins Swelling  ? ? ?No current facility-administered medications on file prior to  encounter.  ? ?Current Outpatient Medications on File Prior to Encounter  ?Medication Sig Dispense Refill  ? carbidopa-levodopa (SINEMET IR) 25-100 MG tablet Take 1 tablet by mouth 3 (three) times daily.    ? hydrochlorothiazide (HYDRODIURIL) 12.5 MG tablet Take 12.5 mg by mouth daily.    ? losartan (COZAAR) 50 MG tablet Take 50 mg by mouth daily.    ? metFORMIN (GLUCOPHAGE) 1000 MG tablet Take 1,000 mg by mouth 2 (two) times daily.    ? aspirin 81 MG tablet Take 1 tablet (81 mg total) by mouth daily.    ? Multiple Vitamin (MULTIVITAMIN) tablet Take 1 tablet by mouth daily.    ? PROAIR HFA 108 (90 Base) MCG/ACT inhaler INHALE 2 PUFFS BY MOUTH EVERY 4 HOURS ASNEEDED FOR WHEEZING OR SHORTNESS OF BREATH 8.5 g 0  ? umeclidinium-vilanterol (ANORO ELLIPTA) 62.5-25 MCG/INH AEPB INHALE 1 PUFF INTO THE LUNGS ONCE DAILY    ? ? ? ?Results for orders placed or performed during the hospital encounter of 02/21/22 (from the past 48 hour(s))  ?Resp Panel by RT-PCR (Flu A&B, Covid) Nasopharyngeal Swab     Status: None  ? Collection Time: 02/21/22  9:42  AM  ? Specimen: Nasopharyngeal Swab; Nasopharyngeal(NP) swabs in vial transport medium  ?Result Value Ref Range  ? SARS Coronavirus 2 by RT PCR NEGATIVE NEGATIVE  ?  Comment: (NOTE) ?SARS-CoV-2 target nucleic acids are NOT DETECTED. ? ?The SARS-CoV-2 RNA is generally detectable in upper respiratory ?specimens during the acute phase of infection. The lowest ?concentration of SARS-CoV-2 viral copies this assay can detect is ?138 copies/mL. A negative result does not preclude SARS-Cov-2 ?infection and should not be used as the sole basis for treatment or ?other patient management decisions. A negative result may occur with  ?improper specimen collection/handling, submission of specimen other ?than nasopharyngeal swab, presence of viral mutation(s) within the ?areas targeted by this assay, and inadequate number of viral ?copies(<138 copies/mL). A negative result must be combined  with ?clinical observations, patient history, and epidemiological ?information. The expected result is Negative. ? ?Fact Sheet for Patients:  ?EntrepreneurPulse.com.au ? ?Fact Sheet for Healthcare P

## 2022-02-21 NOTE — Progress Notes (Signed)
Pharmacy consult: ?need to hold anythign that woudl cause neuromuscular problems ? ?76 yo Male w/ Fever of unknown origin, hx parkinsons (recently started on sinement). ?Severe tremors as well as fevers ? ?See Neurology note 02/21/22 ? ?At this time. The medications currently ordered do not appear to need to be held. ? ?Will continue to follow ? ? ?Chinita Greenland PharmD ?Clinical Pharmacist ?02/21/2022 ? ? ?

## 2022-02-21 NOTE — ED Notes (Signed)
Pt having new temp of 102.1, per wife pt spikes a fever with episodes of "shaking". Dr Sheppard Coil notified. PRN ibuprofen given.  ?

## 2022-02-21 NOTE — Consult Note (Addendum)
Pharmacy Antibiotic Note ? ?Darren Sharp is a 76 y.o. male with medical history including COPD, diabetes, HTN, recent diagnosis of Parkinson's disease admitted on 02/21/2022 with  chief complaint of weakness, fatigue, fever, worsening tremors / possibly rigors . Code sepsis called in the ED. Source of infection unclear at this time. Pharmacy has been consulted for vancomycin and aztreonam dosing. Patient is also ordered metronidazole. ? ?Spoke with patient and spouse regarding penicillin allergy. Occurred when patient was a child after receiving a penicillin injection. Patient suffered swelling of face, tongue, and extremities. No documentation of receipt of beta lactams receipt in EMR. ? ?Patient appears to have AKI with Scr of 1.5 (last Scr 1.1 on 09/25/21). Anticipate improvement with fluid resuscitation given in ED. ? ?Plan: ? ?Aztreonam 2 g IV q8h ? ?Vancomycin 1.5 g IV LD followed by maintenance regimen of vancomycin 1 g IV q24h ?--Calculated AUC: 522, Cmin 13.7 ?--Daily Scr per protocol ?--Levels at steady state or as clinically indicated ? ?Height: 5' 8.5" (174 cm) ?Weight: 68 kg (149 lb 14.6 oz) ?IBW/kg (Calculated) : 69.55 ? ?Temp (24hrs), Avg:100.3 ?F (37.9 ?C), Min:98.4 ?F (36.9 ?C), Max:102.1 ?F (38.9 ?C) ? ?Recent Labs  ?Lab 02/21/22 ?0942 02/21/22 ?1012 02/21/22 ?1535  ?WBC 7.9  --   --   ?CREATININE 1.50*  --   --   ?LATICACIDVEN  --  2.0* 1.7  ?  ?Estimated Creatinine Clearance: 41.6 mL/min (A) (by C-G formula based on SCr of 1.5 mg/dL (H)).   ? ?Allergies  ?Allergen Reactions  ? Penicillins Swelling  ? ? ?Antimicrobials this admission: ?Vancomycin 4/5 >> ?Metronidazole 4/5 >>  ?Aztreonam 4/5 >>  ? ?Dose adjustments this admission: ?N/A ? ?Microbiology results: ?20/5 BCx: pending ?4/5 UCx: pending  ?4/5 MRSA PCR: pending ? ?Thank you for allowing pharmacy to be a part of this patient?s care. ? ?Benita Gutter ?02/21/2022 5:50 PM ? ?

## 2022-02-21 NOTE — Progress Notes (Addendum)
Went to patient's room to update on plan based on my discussion w/ neurology. He is resting comfortably. Afebrile, no pain, no shaking/tremor. CT Abd/Pelvis reviewed - no infection noted, will d/c metronidazole. Wife present at bedside. All questions answered. See updated H&P and Dr. Lyn Records note.  ?

## 2022-02-21 NOTE — ED Provider Notes (Signed)
? ?[facility] ?Provider Note ? ? ? Event Date/Time  ? First MD Initiated Contact with Patient 02/21/22 [phone]   ?  (approximate) ? ?History  ? ?Chief Complaint: Weakness ? ?HPI ? ?Darren Sharp is a 76 y.o. male with a past medical history of COPD, diabetes, hypertension, recent diagnosis of Parkinson's disease 1 month ago presents to the emergency department for generalized fatigue and weakness.  According to the patient over the past 3 days he has been feeling very weak and fatigued.  Wife states she measured a temperature last night of 102.  Patient denies any dysuria but wife states the urine yesterday looked almost orange in appearance.  Patient does state he has had slight cough and congestion.  No vomiting or diarrhea.   ? ?Physical Exam  ? ?Triage Vital Signs: ?ED Triage Vitals  ?Enc Vitals Group  ?   BP 02/21/22 0942 (!) 132/101  ?   Pulse Rate 02/21/22 0942 67  ?   Resp 02/21/22 0942 15  ?   Temp 02/21/22 0942 98.4 ?F (36.9 ?C)  ?   Temp Source 02/21/22 0942 Oral  ?   SpO2 02/21/22 0942 100 %  ?   Weight 02/21/22 0940 149 lb 14.6 oz (68 kg)  ?   Height 02/21/22 0940 5' 8.5" (1.74 m)  ?   Head Circumference --   ?   Peak Flow --   ?   Pain Score 02/21/22 0940 0  ?   Pain Loc --   ?   Pain Edu? --   ?   Excl. in [place]? --   ? ? ?Most recent vital signs: ?Vitals:  ? 02/21/22 0942  ?BP: (!) 132/101  ?Pulse: 67  ?Resp: 15  ?Temp: 98.4 ?F (36.9 ?C)  ?SpO2: 100%  ? ? ?General: Awake, no distress.  Patient is quite tremulous in bed but did take his Parkinson's medication this morning. ?CV:  Good peripheral perfusion.  Regular rate and rhythm  ?Resp:  Normal effort.  Equal breath sounds bilaterally.  Occasional cough/congestion. ?Abd:  No distention.  Soft, nontender.  No rebound or guarding. ? ? ?ED Results / Procedures / Treatments  ? ?EKG ? ?EKG viewed and interpreted by myself shows an undetermined rhythm given the significant electrical interference does appear to be sinus such as in lead  V1/V6.  Rate around 107 bpm narrow QRS, normal axis, normal intervals, nonspecific ST changes.  Significant logical interference due to Parkinson's and tremor. ? ?RADIOLOGY ? ?I reviewed the CT images, no acute abnormality on my evaluation. ?Radiology is read the CT is negative for PE.  No obvious infection ? ? ?MEDICATIONS ORDERED IN ED: ?Medications  ?sodium chloride 0.9 % bolus 1,000 mL (has no administration in time range)  ? ? ? ?IMPRESSION / MDM / ASSESSMENT AND PLAN / ED COURSE  ?I reviewed the triage vital signs and the nursing notes. ? ?Patient presents to the emergency department for generalized fatigue and weakness over the past 2 to 3 days.  Wife states a fever to 102 last night.  Patient does have some cough and congestion and wife states dark urine yesterday.  We will check labs, cultures, lactic acid we will treat with IV fluids while awaiting results.  Differential is quite broad but would include infectious etiology such as urinary tract infection, COVID/influenza, pneumonia. ? ?Patient's work-up showed a lactate of 2.0.  We will cover with antibiotics given the reported fever of 102.7 per wife.  Wife states he gave the patient Tylenol prior to coming to the emergency department.  Remains afebrile.  Given the patient's weakness and ataxia we will obtain an MRI of the brain as the wife states patient was just diagnosed with a stroke, 1 month ago.  Patient states cough, negative x-ray, CTA of the chest is negative.  Reassuringly normal CBC with a normal white blood cell count, COVID/flu is negative.  Urinalysis shows rare bacteria but otherwise normal.  Urine culture has been sent.  Blood cultures have been sent.  We will admit to the hospital service for continued antibiotics given the patient's weakness reported fever ataxia.  Patient agreeable to plan. ? ?FINAL CLINICAL IMPRESSION(S) / ED DIAGNOSES  ? ?Fever ?Fatigue/weakness ? ?Note:  This document was prepared using Dragon voice recognition  software and may include unintentional dictation errors. ?  ?Harvest Dark, MD ?02/21/22 1550 ? ?

## 2022-02-21 NOTE — ED Triage Notes (Signed)
BIB EMS from home. Complaint of weakness and low energy. Tremors have worsened since new diagnosis of parkinson 3 weeks . ago. Pt vomited yesteday one time.  ?148/44 ?166 BGL  ?98.1 T ?98% on RA ? ?

## 2022-02-21 NOTE — Assessment & Plan Note (Addendum)
No new symptoms, MRI brain 02/21/22 no new concerns ?Holding aspirin for possible LP as advised by neurology. ?Holding statin for mild transaminitis  ?

## 2022-02-21 NOTE — Assessment & Plan Note (Addendum)
Fever at home reported to be 102, Tylenol administered prior to presentation to the ED, afebrile until this evening (approx 17:30 02/21/22) at which point up to 102.1] ?Lactate initially 2.0, trending down to 1.7,  ?Normal WBC ?No concerns on chest x-ray ?No concerns on CT chest ?Urinalysis no apparent UTI ?No abdominal pain ?No headache, no rash, meningitis seems very unlikely but may consider LP per neurology ?Preliminary blood cultures negative, urine cultures pending.  Procalcitonin at 3.62 ?MRI brain no concerns ?Continue to monitor VS ?CT abdomen was also negative for any acute infection, mild pericolic stranding. ?No abdominal symptoms. ?Afebrile since morning. ?-Discontinue vancomycin ?-Continue with aztreonam ?-Continue to monitor ?-Per neurology might need LP if no other source found, no sign of meningeal irritation. ?

## 2022-02-21 NOTE — Assessment & Plan Note (Addendum)
-  Continue sinemet  ?Neuro consult placed - appreciate recommendations re: whether his parkinson's can explain any of his symptoms, if another neuro cause might be present, if he should have LP to evaluate fever, if need to adjust medications (patient exhibits significant tremors/body shaking, almost myoclonus, when his sinemet is wearing off, which is not consistent w/ severity of relatively new diagnosis)  ?

## 2022-02-21 NOTE — Assessment & Plan Note (Addendum)
Hold home meds given mild increase creatinine  ?SSI while inpatient  ?Add 5 units of Semglee twice daily ?

## 2022-02-21 NOTE — Assessment & Plan Note (Addendum)
Blood pressure within normal limit. ?-Holding home dose of HCTZ and losartan due to AKI. ?-Can use as needed hydralazine for systolic above 728  ?

## 2022-02-21 NOTE — ED Notes (Signed)
Rn to bedside to assist pt. Pulse ox 69% on RA with normal waveform. Placed on [place] at 2 liters. Improved to 75%. Increased to 6 liters oxygen improved to 85%.  ?

## 2022-02-21 NOTE — Consult Note (Signed)
CODE SEPSIS - PHARMACY COMMUNICATION ? ?**Broad Spectrum Antibiotics should be administered within 1 hour of Sepsis diagnosis** ? ?Time Code Sepsis Called/Page Received: 6728 ? ?Antibiotics Ordered: 1530 ? ?Time of 1st antibiotic administration: 1649 ? ?Additional action taken by pharmacy:  ?1410-messaged RN Berniece Salines) to ensure meds available and in stock to help triage. ?Meds available but pt has been off the floor to MRI. ? ?If necessary, Name of Provider/Nurse Contacted: RN Soil scientist) ? ?Lorna Dibble ,PharmD ?Clinical Pharmacist  ?02/21/2022  4:07 PM ? ?

## 2022-02-21 NOTE — Plan of Care (Signed)
Discussed with Dr. Sheppard Coil via phone ? ?This is a 76 year old gentleman with past medical history significant for Parkinson's disease recently started on Sinemet, COPD, diabetes, hypertension.  Reportedly recently prescribed an SSRI but has not yet started taking it.  Has been having with severe tremors towards the time his neck Sinemet dose is due as well as fevers and change in the color of his urine, but no alteration in his mental status. ? ?He was treated with broad-spectrum antibiotics at the ED (vancomycin, metronidazole, aztreonam) as well as 1 L normal saline which improved his lactate from 2-1.7 ? ?Labs are additionally notable for increased creatinine to 1.5 from baseline of 1, AST/ALT elevations (91 and 57), mild anemia of 11.9, and UA negative for infection but notable for small hemoglobin pigment with no red blood cells and proteinuria as well as ketones ? ?Infectious work-up is in progress including blood cultures, urine culture, negative chest x-ray and CT angio chest PE protocol (patient briefly hypoxic which spontaneously resolved) as well is negative MRI brain.  CT chest abdomen pelvis pending. ? ?Past Medical History:  ?Diagnosis Date  ? COPD (chronic obstructive pulmonary disease) ([place])   ? COPD (chronic obstructive pulmonary disease) ([place])   ? Diabetes mellitus without complication ([place])   ? Hypertension   ?  ?Current Outpatient Medications  ?Medication Instructions  ? Acetylcysteine (N-ACETYL-L-CYSTEINE PO) 1 capsule, Oral, See admin instructions, Take 1 capsule ('1000mg'$ ) by mouth every morning and take 1 capsule ('1000mg'$ ) by mouth every night  ? aspirin 81 mg, Oral, Daily  ? carbidopa-levodopa (SINEMET IR) 25-100 MG tablet 1 tablet, Oral, 3 times daily  ? hydrochlorothiazide (HYDRODIURIL) 12.5 mg, Oral, Daily  ? Ipratropium-Albuterol (COMBIVENT RESPIMAT) 20-100 MCG/ACT AERS respimat 2 puffs, Inhalation, Every 6 hours PRN  ? levofloxacin (LEVAQUIN) 500 mg, Oral, Daily, (x7 days)  ? losartan  (COZAAR) 50 mg, Oral, Daily  ? metFORMIN (GLUCOPHAGE) 1,000 mg, Oral, 2 times daily  ? Multiple Vitamin (MULTIVITAMIN) tablet 1 tablet, Oral, Daily  ? OVER THE COUNTER MEDICATION 1 capsule, Oral, Daily with lunch, **[employer] - ClearLungs Extra Strength**  ? rosuvastatin (CRESTOR) 10 mg, Oral, Every M-W-F  ? sertraline (ZOLOFT) 50 mg, Oral, Daily  ? umeclidinium-vilanterol (ANORO ELLIPTA) 62.5-25 MCG/ACT AEPB 1 puff, Inhalation, Daily  ? vitamin E (VITAMIN E) 400 Units, Oral, Daily  ? ?The patient is described to me has symptoms concerning for infection versus drug-induced dysautonomia such as serotonin syndrome or NMS (however the latter is typically only seen in Parkinson's patients when they miss several doses of Sinemet after having been on high doses for a long period of time, and this patient has only recently been diagnosed and is on low doses). ? ?Initial recommendations: ?-Hold sertraline and any medications that would worsen serotonin syndrome or NMS including neuroleptics, antiemetics (droperidol/domperidone/metoclopramide/promethazine/prochlorperazin), ?-Appreciate pharmacy review to avoid any medications that may aggravate potential NMS or serotonin syndrome ?-CK and GGT to evaluate etiology of elevated AST/ALT, please treat with fluids appropriately if CK returns very elevated ?-Continue home Sinemet, please make an exception for this medication even if patient is n.p.o. otherwise ?-Hold home statin ?-Appreciate broad infectious work-up, patient will need lumbar puncture if this is unrevealing; in this setting recommend holding aspirin if possible ?-Holding on dantrolene and bromocriptine at this time as diagnosis is unclear ?-If he begins to have significant decline in mental status he may need transfer to [facility] for emergent EEG, and that at this time if he remains appropriately responsive remaining at  [facility] is appropriate ?-Neurology will follow ? ?Lesleigh Noe MD-PhD ?[facility] ?[phone]  ?[facility] coverage for [facility] is from 8 AM to 4 AM in-house and 4 PM to 8 PM by telephone/video. 8 PM to 8 AM emergent questions or overnight urgent questions should be addressed to Teleneurology On-call or [facility] neurohospitalist; contact information can be found on AMION ? ?No charge plan of care note ? ?

## 2022-02-21 NOTE — ED Notes (Signed)
Pt having episode of shaking uncontrollably. Dr Sheppard Coil at bedside. No new orders at this time.  ?

## 2022-02-21 NOTE — ED Notes (Signed)
Pt transported to MRI, will proceed with sepsis meds when pt returns ?

## 2022-02-22 ENCOUNTER — Encounter: Payer: Self-pay | Admitting: Osteopathic Medicine

## 2022-02-22 DIAGNOSIS — R509 Fever, unspecified: Secondary | ICD-10-CM | POA: Diagnosis not present

## 2022-02-22 LAB — CBC
HCT: 28.1 % — ABNORMAL LOW (ref 39.0–52.0)
Hemoglobin: 9.7 g/dL — ABNORMAL LOW (ref 13.0–17.0)
MCH: 31.5 pg (ref 26.0–34.0)
MCHC: 34.5 g/dL (ref 30.0–36.0)
MCV: 91.2 fL (ref 80.0–100.0)
Platelets: 134 10*3/uL — ABNORMAL LOW (ref 150–400)
RBC: 3.08 MIL/uL — ABNORMAL LOW (ref 4.22–5.81)
RDW: 12.7 % (ref 11.5–15.5)
WBC: 5.7 10*3/uL (ref 4.0–10.5)
nRBC: 0 % (ref 0.0–0.2)

## 2022-02-22 LAB — BASIC METABOLIC PANEL
Anion gap: 10 (ref 5–15)
BUN: 43 mg/dL — ABNORMAL HIGH (ref 8–23)
CO2: 22 mmol/L (ref 22–32)
Calcium: 8.1 mg/dL — ABNORMAL LOW (ref 8.9–10.3)
Chloride: 97 mmol/L — ABNORMAL LOW (ref 98–111)
Creatinine, Ser: 1.75 mg/dL — ABNORMAL HIGH (ref 0.61–1.24)
GFR, Estimated: 40 mL/min — ABNORMAL LOW (ref >60)
Glucose, Bld: 208 mg/dL — ABNORMAL HIGH (ref 70–99)
Potassium: 3.9 mmol/L (ref 3.5–5.1)
Sodium: 129 mmol/L — ABNORMAL LOW (ref 135–145)

## 2022-02-22 LAB — MRSA NEXT GEN BY PCR, NASAL: MRSA by PCR Next Gen: NOT DETECTED

## 2022-02-22 LAB — PATHOLOGIST SMEAR REVIEW

## 2022-02-22 LAB — PROCALCITONIN: Procalcitonin: 3.28 ng/mL

## 2022-02-22 MED ORDER — SODIUM CHLORIDE 0.9 % IV SOLN: INTRAVENOUS | Status: AC

## 2022-02-22 MED ORDER — VANCOMYCIN HCL 750 MG/150ML IV SOLN: 750.0000 mg | INTRAVENOUS | Status: DC

## 2022-02-22 NOTE — TOC Initial Note (Signed)
Transition of Care (TOC) - Initial/Assessment Note  ? ? ?Patient Details  ?Name: Darren Sharp ?MRN: [id number] ?Date of Birth: 1946/02/20 ? ?Transition of Care (TOC) CM/SW Contact:    ?Pete Pelt, RN ?Phone Number: ?02/22/2022, 1:55 PM ? ?Clinical Narrative:     Patient lives with spouse who can typically assist him if needed.  He has no concerns about transportation, and is up to date with all of his appointments including PCP.  He has no concerns about medications, and uses a pill box to assist him with remembering to take medications. ? ?Physical Therapy has yet to assess patient, but according to patient and spouse, he was independent prior to admission, and walked in the park this past weekend.  Patient and spouse are apprehensive about returning home for the sole reason that he has lost his independence.  RNCM offered therapeutic listening and explained that Physical Therapy would assess patient and provide recommendations. Following this, care team will discuss recommendations and set up any necessary services to assist them prior to discharge.    ? ?TOC will follow for recommendations.        ? ? ?Expected Discharge Plan:  (pending PT eval) ?Barriers to Discharge: Continued Medical Work up ? ? ?Patient Goals and CMS Choice ?Patient states their goals for this hospitalization and ongoing recovery are:: To be able independent with activity like I was before I came in ?  ?Choice offered to / list presented to : NA ? ?Expected Discharge Plan and Services ?Expected Discharge Plan:  (pending PT eval) ?  ?Discharge Planning Services: CM Consult ?Post Acute Care Choice:  (TBD) ?Living arrangements for the past 2 months: [place] ?                ?  ?  ?  ?  ?  ?  ?  ?  ?  ?  ? ?Prior Living Arrangements/Services ?Living arrangements for the past 2 months: [place] ?Lives with:: Self, Spouse ?Patient language and need for interpreter reviewed:: Yes (No interpreter required) ?Do you feel safe  going back to the place where you live?: Yes      ?Need for Family Participation in Patient Care: Yes (Comment) ?Care giver support system in place?: Yes (comment) ?Current home services:  (No current home services) ?Criminal Activity/Legal Involvement Pertinent to Current Situation/Hospitalization: No - Comment as needed ? ?Activities of Daily Living ?Home Assistive Devices/Equipment: Dentures (specify type), Eyeglasses ?ADL Screening (condition at time of admission) ?Patient's cognitive ability adequate to safely complete daily activities?: Yes ?Is the patient deaf or have difficulty hearing?: Yes ?Does the patient have difficulty seeing, even when wearing glasses/contacts?: No ?Does the patient have difficulty concentrating, remembering, or making decisions?: No ?Patient able to express need for assistance with ADLs?: Yes ?Does the patient have difficulty dressing or bathing?: No ?Independently performs ADLs?: Yes (appropriate for developmental age) ?Does the patient have difficulty walking or climbing stairs?: No ?Weakness of Legs: None ?Weakness of Arms/Hands: None ? ?Permission Sought/Granted ?Permission sought to share information with : Case Manager ?Permission granted to share information with : Yes, Verbal Permission Granted ?   ?   ?   ?   ? ?Emotional Assessment ?Appearance:: Appears stated age ?Attitude/Demeanor/Rapport: Gracious, Engaged ?Affect (typically observed): Pleasant, Appropriate ?Orientation: : Oriented to Self, Oriented to Place, Oriented to  Time, Oriented to Situation ?Alcohol / Substance Use: Not Applicable ?Psych Involvement: No (comment) ? ?Admission diagnosis:  Fever of unknown origin (  FUO) [R50.9] ?Weakness [R53.1] ?Patient Active Problem List  ? Diagnosis Date Noted  ? Fever of unknown origin (FUO), generalized weakness 02/21/2022  ? Weakness   ? Parkinson's disease ([place]) 02/19/2022  ? Major depressive disorder, recurrent, mild ([place]) 02/19/2022  ? Visual disturbance as complication  of stroke 78/29/5621  ? PAD (peripheral artery disease) ([place]) 01/11/2020  ? Tubular adenoma 12/30/2019  ? Aortic atherosclerosis ([place]) 09/14/2019  ? Benign essential hypertension 09/14/2019  ? DM type 2 with diabetic mixed hyperlipidemia ([place]) 09/14/2019  ? Medicare annual wellness visit, initial 09/14/2019  ? Panlobular emphysema ([place]) 09/14/2019  ? Hypertension 01/28/2017  ? Hyperlipidemia 01/28/2017  ? Bronchitis 01/28/2017  ? History of COPD 01/28/2017  ? Hx of retinal detachment 01/28/2017  ? Hx of diabetes mellitus 01/28/2017  ? ?PCP:  Rusty Aus, MD ?Pharmacy:   ?[id number] [place] [id number]?Phone: [phone]: [phone]? ? ? ? ?Social Determinants of Health (SDOH) Interventions ?  ? ?Readmission Risk Interventions ? ?  02/22/2022  ?  1:53 PM  ?Readmission Risk Prevention Plan  ?Post Dischage Appt Complete  ?Medication Screening Complete  ?Transportation Screening Complete  ? ? ? ?

## 2022-02-22 NOTE — Evaluation (Signed)
Physical Therapy Evaluation ?Patient Details ?Name: Darren Sharp ?MRN: [id number] ?DOB: 05-21-1946 ?Today's Date: 02/22/2022 ? ?History of Present Illness ? 76 y.o. male with a past medical history of COPD, diabetes, hypertension, recent diagnosis of Parkinson's disease 1 month ago presents to the emergency department for generalized fatigue and weakness.  According to the patient over the past 3 days he has been feeling very weak, fatigued and unable to maintain standing.  ?Clinical Impression ? Pt did very well with PT exam, he was able to circumambulate the nurses' station with relatively ease, did occasionally reach toward hallway rail but did not have any overt staggering, tremoring or other issues concerning for ambulation/safety.  He apparently could not even maintain standing just 2-3 days ago, but normally walks >1 mile regularly w/o AD.  He is not at his baseline but is safe to return home.  Discussed possible need for ADs in the future but he does not need one now. Recommending HHPT to further ease transition back to his active baseline. ?   ? ?Recommendations for follow up therapy are one component of a multi-disciplinary discharge planning process, led by the attending physician.  Recommendations may be updated based on patient status, additional functional criteria and insurance authorization. ? ?Follow Up Recommendations Home health PT ? ?  ?Assistance Recommended at Discharge PRN  ?Patient can return home with the following ?   ? ?  ?Equipment Recommendations None recommended by PT  ?Recommendations for Other Services ?    ?  ?Functional Status Assessment Patient has had a recent decline in their functional status and demonstrates the ability to make significant improvements in function in a reasonable and predictable amount of time.  ? ?  ?Precautions / Restrictions Precautions ?Precautions: Fall ?Restrictions ?Weight Bearing Restrictions: No  ? ?  ? ?Mobility ? Bed Mobility ?Overal bed mobility:  Modified Independent ?  ?  ?  ?  ?  ?  ?General bed mobility comments: Pt able to get himself up to sitting w/o hesitation or need for assist ?  ? ?Transfers ?Overall transfer level: Modified independent ?Equipment used: None ?  ?  ?  ?  ?  ?  ?  ?General transfer comment: light UE use to rise to standing but able to maintain balance w/ only transient,  light UE stabilization ?  ? ?Ambulation/Gait ?Ambulation/Gait assistance: Supervision ?Gait Distance (Feet): 250 Feet ?Assistive device: None ?  ?  ?  ?  ?General Gait Details: Initial ~15 ft with walker but it was obvious that he did not need that much support and we did the remaineder with little to no UE support.  He did reach toward rail in hallway occasionally but ultimately was able to maintain consistent speed and safety with minimal need for UE support ? ?Stairs ?Stairs: Yes ?Stairs assistance: Supervision ?Stair Management: One rail Right, Alternating pattern ?Number of Stairs: 6 ?General stair comments: Pt able to ascend/descend ? ?Wheelchair Mobility ?  ? ?Modified Rankin (Stroke Patients Only) ?  ? ?  ? ?Balance Overall balance assessment: Modified Independent ?  ?  ?  ?  ?  ?  ?  ?  ?  ?  ?  ?  ?  ?  ?  ?  ?  ?  ?   ? ? ? ?Pertinent Vitals/Pain Pain Assessment ?Pain Assessment: No/denies pain  ? ? ?Home Living Family/patient expects to be discharged to:: Private residence ?Living Arrangements: Spouse/significant other ?Available Help at Discharge: Family;Available 24 hours/day ?  ?  Home Access: Stairs to enter ?Entrance Stairs-Rails: Can reach both ?Entrance Stairs-Number of Steps: 4 ?  ?  ?Home Equipment: None ?   ?  ?Prior Function Prior Level of Function : Independent/Modified Independent ?  ?  ?  ?  ?  ?  ?Mobility Comments: Pt walks 1-1.5 miles multiple times a week, regularly out of the home and relatively active ?  ?  ? ? ?Hand Dominance  ?   ? ?  ?Extremity/Trunk Assessment  ? Upper Extremity Assessment ?Upper Extremity Assessment: Overall WFL for  tasks assessed ?  ? ?Lower Extremity Assessment ?Lower Extremity Assessment: Overall WFL for tasks assessed ?  ? ?   ?Communication  ? Communication: No difficulties  ?Cognition Arousal/Alertness: Awake/alert ?Behavior During Therapy: [facility] for tasks assessed/performed ?Overall Cognitive Status: Within Functional Limits for tasks assessed ?  ?  ?  ?  ?  ?  ?  ?  ?  ?  ?  ?  ?  ?  ?  ?  ?  ?  ?  ? ?  ?General Comments General comments (skin integrity, edema, etc.): Pt not moving as well as baseline but, per pt and wife, has made a 180* turn from what his mobility looked like 2 days ago ? ?  ?Exercises    ? ?Assessment/Plan  ?  ?PT Assessment Patient needs continued PT services  ?PT Problem List Decreased balance;Decreased activity tolerance;Decreased knowledge of use of DME;Decreased safety awareness ? ?   ?  ?PT Treatment Interventions DME instruction;Gait training;Stair training;Functional mobility training;Therapeutic activities;Therapeutic exercise;Balance training;Neuromuscular re-education;Patient/family education   ? ?PT Goals (Current goals can be found in the Care Plan section)  ?Acute Rehab PT Goals ?Patient Stated Goal: go home ?PT Goal Formulation: With patient ?Time For Goal Achievement: 03/08/22 ?Potential to Achieve Goals: Good ? ?  ?Frequency Min 2X/week ?  ? ? ?Co-evaluation   ?  ?  ?  ?  ? ? ?  ?AM-PAC PT "6 Clicks" Mobility  ?Outcome Measure Help needed turning from your back to your side while in a flat bed without using bedrails?: None ?Help needed moving from lying on your back to sitting on the side of a flat bed without using bedrails?: None ?Help needed moving to and from a bed to a chair (including a wheelchair)?: None ?Help needed standing up from a chair using your arms (e.g., wheelchair or bedside chair)?: None ?Help needed to walk in hospital room?: None ?Help needed climbing 3-5 steps with a railing? : None ?6 Click Score: 24 ? ?  ?End of Session Equipment Utilized During Treatment: Gait  belt ?Activity Tolerance: Patient tolerated treatment well ?Patient left: in chair;with call bell/phone within reach ?Nurse Communication: Mobility status ?PT Visit Diagnosis: Unsteadiness on feet (R26.81);Other abnormalities of gait and mobility (R26.89) ?  ? ?Time: 1610-9604 ?PT Time Calculation (min) (ACUTE ONLY): 17 min ? ? ?Charges:   PT Evaluation ?$PT Eval Low Complexity: 1 Low ?  ?  ?   ? ? ?Kreg Shropshire, DPT ?02/22/2022, 4:52 PM ? ?

## 2022-02-22 NOTE — Progress Notes (Signed)
?Progress Note ? ? ?Patient: Darren Sharp JKK:[id number] DOB: 01/29/1946 DOA: 02/21/2022     1 ?DOS: the patient was seen and examined on 02/22/2022 ?  ?Brief hospital course: ?Taken from H&P. ? ?? Darren Sharp is a 76 y.o. male with a past medical history of COPD, diabetes, hypertension, recent diagnosis of Parkinson's disease 1 month ago presents to the emergency department for generalized fatigue and weakness.  According to the patient over the past 3 days he has been feeling very weak and fatigued.  Wife states she measured a temperature last night of 102.  Patient denies any dysuria but wife states the urine yesterday looked almost orange in appearance.  Patient does state he has had slight cough and congestion.  No vomiting or diarrhea." ?? Recent records reviewed, patient has been following with neurology for recent diagnosis of Parkinson's.  Started on Sinemet which he takes 3 times daily.  Also some concern for depression related to recent Parkinson's diagnosis, notes she recent prescription for sertraline 50 mg but patient states that he has not started this medication. ? ?Patient was afebrile on initial presentation to ED, later developed fever at 102.1. ?Had an episode of transient hypoxia which resolved quickly. ?Patient denies any urinary symptoms and UA was negative for any leukocytosis, has some hemoglobinuria but no RBCs.  Also found to have elevated CK. ?Blood cultures remain negative so far. ?Chest x-ray and CT chest was negative for any acute abnormality rather resolution of prior infiltrate and lymph nodes. ?CT abdomen and pelvis was also negative for any concern of acute inflammation or infection.  There was cholelithiasis but no cholecystitis or inflammatory changes in the gallbladder.  Mild retroperitoneal and pericolic gutter stranding was noted likely reactive. ?CT head and MRI brain was also negative for any acute abnormalities. ? ?Neurology was consulted for concern of worsening tremors  intermittently, concern of serotonin syndrome or NMS, per neurology note NMS is very less likely as it is typically seen in Parkinson's patients when they missed several doses of Sinemet or having been on higher doses for a long time, this patient was only recently diagnosed and is on low doses.  He was prescribed SSRI but has not started yet. ? ?No leukocytosis, procalcitonin elevated at 3.28. ?CK elevated at 942 and gamma GT was negative. ? ?Patient was started on IV fluid today due to elevated CK and little worsening of creatinine. ?He was started on aztreonam and vancomycin, also received a dose of Flagyl. ?Discontinuing vancomycin due to AKI and no obvious source yet. ?Penicillin allergies noted. ? ? ? ?Assessment and Plan: ?* Fever of unknown origin (FUO), generalized weakness ?Fever at home reported to be 102, Tylenol administered prior to presentation to the ED, afebrile until this evening (approx 17:30 02/21/22) at which point up to 102.1] ?Lactate initially 2.0, trending down to 1.7,  ?Normal WBC ?No concerns on chest x-ray ?No concerns on CT chest ?Urinalysis no apparent UTI ?No abdominal pain ?No headache, no rash, meningitis seems very unlikely but may consider LP per neurology ?Preliminary blood cultures negative, urine cultures pending.  Procalcitonin at 3.62 ?MRI brain no concerns ?Continue to monitor VS ?CT abdomen was also negative for any acute infection, mild pericolic stranding. ?No abdominal symptoms. ?Afebrile since morning. ?-Discontinue vancomycin ?-Continue with aztreonam ?-Continue to monitor ?-Per neurology might need LP if no other source found, no sign of meningeal irritation. ? ?Parkinson's disease ([place]) ?-Continue sinemet  ?Neuro consult placed - appreciate recommendations re: whether his  parkinson's can explain any of his symptoms, if another neuro cause might be present, if he should have LP to evaluate fever, if need to adjust medications (patient exhibits significant tremors/body  shaking, almost myoclonus, when his sinemet is wearing off, which is not consistent w/ severity of relatively new diagnosis)  ? ?Hypertension ?Blood pressure within normal limit. ?-Holding home dose of HCTZ and losartan due to AKI. ?-Can use as needed hydralazine for systolic above 194  ? ?DM type 2 with diabetic mixed hyperlipidemia ([place]) ?Hold home meds given mild increase creatinine  ?SSI while inpatient  ?Add 5 units of Semglee twice daily ? ?Visual disturbance as complication of stroke ?No new symptoms, MRI brain 02/21/22 no new concerns ?Holding aspirin for possible LP as advised by neurology. ?Holding statin for mild transaminitis  ? ? ?Subjective: Patient was seen and examined today.  Denies any complaints.  He really think that he is at his baseline and was asking about discharge. ? ?Physical Exam: ?Vitals:  ? 02/22/22 0435 02/22/22 0850 02/22/22 1146 02/22/22 1406  ?BP: (!) 118/53 (!) 130/50 (!) 106/54   ?Pulse: 60 (!) 49 69   ?Resp: '18 16 16   '$ ?Temp: 97.6 ?F (36.4 ?C) 98.5 ?F (36.9 ?C) (!) 97.3 ?F (36.3 ?C)   ?TempSrc: Axillary Oral Oral   ?SpO2: 100% 100% 100% 98%  ?Weight:      ?Height:      ? ?General.  Frail elderly man, in no acute distress. ?Pulmonary.  Lungs clear bilaterally, normal respiratory effort. ?CV.  Regular rate and rhythm, no JVD, rub or murmur. ?Abdomen.  Soft, nontender, nondistended, BS positive. ?CNS.  Alert and oriented .  No focal neurologic deficit. ?Extremities.  No edema, no cyanosis, pulses intact and symmetrical. ?Psychiatry.  Judgment and insight appears normal. ? ?Data Reviewed: ?Prior notes, labs and images reviewed ? ?Family Communication:  ? ?Disposition: ?Status is: Inpatient ?Remains inpatient appropriate because: Severity of illness ? ? Planned Discharge Destination: Home ? ?DVT prophylaxis.  Lovenox ? ?Time spent: 50 minutes ? ?This record has been created using Systems analyst. Errors have been sought and corrected,but may not always be located.  Such creation errors do not reflect on the standard of care. ? ?Author: ?Lorella Nimrod, MD ?02/22/2022 3:03 PM ? ?For on call review [email or web address].  ?

## 2022-02-22 NOTE — Consult Note (Signed)
Pharmacy Antibiotic Note ? ?Darren Sharp is a 76 y.o. male with medical history including COPD, diabetes, HTN, recent diagnosis of Parkinson's disease admitted on 02/21/2022 with  chief complaint of weakness, fatigue, fever, worsening tremors / possibly rigors . Code sepsis called in the ED. Source of infection unclear at this time. Pharmacy has been consulted for vancomycin and aztreonam dosing. Patient is also ordered metronidazole. ? ?Spoke with patient and spouse regarding penicillin allergy. Occurred when patient was a child after receiving a penicillin injection. Patient suffered swelling of face, tongue, and extremities. No documentation of receipt of beta lactams receipt in EMR. ? ?Patient appears to have AKI with Scr of 1.5 (last Scr 1.1 on 09/25/21). Anticipate improvement with fluid resuscitation given in ED. ? ?02/22/22: Scr 1.51 > 1.75 ? ?Plan: ? ?Continue Aztreonam 2 g IV q8h ? ?Adjust Vancomycin maintenance regimen to vancomycin 750 mg IV q24h ?--New Calculated AUC: 451, Cmin 12.6 ?--Daily Scr per protocol ?--Levels at steady state or as clinically indicated ? ?Height: 5' 8.5" (174 cm) ?Weight: 68 kg (149 lb 14.6 oz) ?IBW/kg (Calculated) : 69.55 ? ?Temp (24hrs), Avg:99.1 ?F (37.3 ?C), Min:97.4 ?F (36.3 ?C), Max:102.1 ?F (38.9 ?C) ? ?Recent Labs  ?Lab 02/21/22 ?0942 02/21/22 ?1012 02/21/22 ?1535 02/22/22 ?7824  ?WBC 7.9  --   --  5.7  ?CREATININE 1.50*  --   --  1.75*  ?LATICACIDVEN  --  2.0* 1.7  --   ? ?  ?Estimated Creatinine Clearance: 35.6 mL/min (A) (by C-G formula based on SCr of 1.75 mg/dL (H)).   ? ?Allergies  ?Allergen Reactions  ? Penicillins Anaphylaxis and Swelling  ?  Penicillin injection when patient was a child. Experienced swelling of face, tongue, extremities. Required medical attention  ? ? ?Antimicrobials this admission: ?Vancomycin 4/5 >> ?Metronidazole 4/5 >>  ?Aztreonam 4/5 >>  ? ?Dose adjustments this admission: ?02/22/22: Vancomycin 1000 mg > 750 mg Q24H ? ?Microbiology results: ?30/5  BCx: pending ?4/5 UCx: pending  ?4/5 MRSA PCR: pending ? ?Thank you for allowing pharmacy to be a part of this patient?s care. ? ?Dorothe Pea, PharmD, BCPS ?Clinical Pharmacist   ?02/22/2022 7:32 AM ? ?

## 2022-02-22 NOTE — Plan of Care (Signed)

## 2022-02-22 NOTE — Hospital Course (Addendum)
Taken from H&P. ? ?Darren Sharp is a 76 y.o. male with a past medical history of COPD, diabetes, hypertension, recent diagnosis of Parkinson's disease 1 month ago presents to the emergency department for generalized fatigue and weakness.  According to the patient over the past 3 days he has been feeling very weak and fatigued.  Wife states she measured a temperature last night of 102.  Patient denies any dysuria but wife states the urine yesterday looked almost orange in appearance.  Patient does state he has had slight cough and congestion.  No vomiting or diarrhea." ?Recent records reviewed, patient has been following with neurology for recent diagnosis of Parkinson's.  Started on Sinemet which he takes 3 times daily.  Also some concern for depression related to recent Parkinson's diagnosis, notes she recent prescription for sertraline 50 mg but patient states that he has not started this medication. ? ?Patient was afebrile on initial presentation to ED, later developed fever at 102.1. ?Had an episode of transient hypoxia which resolved quickly. ?Patient denies any urinary symptoms and UA was negative for any leukocytosis, has some hemoglobinuria but no RBCs.  Also found to have elevated CK. ?Blood cultures remain negative so far. ?Chest x-ray and CT chest was negative for any acute abnormality rather resolution of prior infiltrate and lymph nodes. ?CT abdomen and pelvis was also negative for any concern of acute inflammation or infection.  There was cholelithiasis but no cholecystitis or inflammatory changes in the gallbladder.  Mild retroperitoneal and pericolic gutter stranding was noted likely reactive. ?CT head and MRI brain was also negative for any acute abnormalities. ? ?Neurology was consulted for concern of worsening tremors intermittently, concern of serotonin syndrome or NMS, per neurology note NMS is very less likely as it is typically seen in Parkinson's patients when they missed several doses of  Sinemet or having been on higher doses for a long time, this patient was only recently diagnosed and is on low doses.  He was prescribed SSRI but has not started yet. ? ?No leukocytosis, procalcitonin elevated at 3.28>>2.96>1.57 ?CK elevated at 942 and normalized next day with IV fluid. ? gamma GT was negative. ? ?Patient was started on IV fluid today due to elevated CK and little worsening of creatinine. ?He was started on aztreonam and vancomycin, also received a dose of Flagyl. ?Discontinuing vancomycin due to AKI and no obvious source yet. ?Penicillin allergies noted. ? ?4/7: Patient had 1 episode of low-grade fever around 100.7 around midnight, blood cultures remain negative.  No other obvious source of infection at this time.  Renal function with improvement in creatinine to 1.48, it was 1.1 in November 2022. ?Neurology decided to proceed with LP after discussing with wife who was very concerned. ?There is a concern of viral meningitis and they started him on acyclovir while waiting for HSV labs. ? ?4/8: Patient appears to be at baseline.  Remained afebrile.  HSV PCR came back negative.  So acyclovir was discontinued.  Preliminary culture of CSF is negative.  Normal protein and mildly elevated blood glucose with serum to blood glucose ratio of 0.5 which is within normal limit.[name] ?Neurology also sent it few more labs which include angiotensin-converting enzyme, RPR, and HIV antibody, cryptococcal antigen, enterovirus PCR and the results are pending. ?Patient is being discharged on 3 more days of Levaquin at a reduced dose of 250 mg daily as recommended by her age. ?We advised him to keep holding losartan and HCTZ as blood pressure is well controlled  at this time and he can follow-up with his PCP for lab results and further management.  PCP can restart antihypertensives as needed. ? ?Patient will continue with the rest of his home medications except mentioned above and will follow-up with his provider. ? ?

## 2022-02-23 DIAGNOSIS — R531 Weakness: Secondary | ICD-10-CM | POA: Diagnosis not present

## 2022-02-23 DIAGNOSIS — R509 Fever, unspecified: Secondary | ICD-10-CM | POA: Diagnosis not present

## 2022-02-23 LAB — BASIC METABOLIC PANEL
Anion gap: 12 (ref 5–15)
BUN: 45 mg/dL — ABNORMAL HIGH (ref 8–23)
CO2: 20 mmol/L — ABNORMAL LOW (ref 22–32)
Calcium: 8.5 mg/dL — ABNORMAL LOW (ref 8.9–10.3)
Chloride: 103 mmol/L (ref 98–111)
Creatinine, Ser: 1.48 mg/dL — ABNORMAL HIGH (ref 0.61–1.24)
GFR, Estimated: 49 mL/min — ABNORMAL LOW (ref >60)
Glucose, Bld: 205 mg/dL — ABNORMAL HIGH (ref 70–99)
Potassium: 3.2 mmol/L — ABNORMAL LOW (ref 3.5–5.1)
Sodium: 135 mmol/L (ref 135–145)

## 2022-02-23 LAB — CSF CELL COUNT WITH DIFFERENTIAL
Eosinophils, CSF: 0 %
Eosinophils, CSF: 0 %
Lymphs, CSF: 35 %
Lymphs, CSF: 7 %
Monocyte-Macrophage-Spinal Fluid: 65 %
Monocyte-Macrophage-Spinal Fluid: 93 %
RBC Count, CSF: 0 /mm3 (ref 0–3)
RBC Count, CSF: 0 /mm3 (ref 0–3)
Segmented Neutrophils-CSF: 0 %
Segmented Neutrophils-CSF: 0 %
Tube #: 1
Tube #: 3
WBC, CSF: 11 /mm3 (ref 0–5)
WBC, CSF: 11 /mm3 (ref 0–5)

## 2022-02-23 LAB — CBC
HCT: 34.2 % — ABNORMAL LOW (ref 39.0–52.0)
Hemoglobin: 11.7 g/dL — ABNORMAL LOW (ref 13.0–17.0)
MCH: 31 pg (ref 26.0–34.0)
MCHC: 34.2 g/dL (ref 30.0–36.0)
MCV: 90.7 fL (ref 80.0–100.0)
Platelets: 151 10*3/uL (ref 150–400)
RBC: 3.77 MIL/uL — ABNORMAL LOW (ref 4.22–5.81)
RDW: 12.8 % (ref 11.5–15.5)
WBC: 4.8 10*3/uL (ref 4.0–10.5)
nRBC: 0 % (ref 0.0–0.2)

## 2022-02-23 LAB — URINE CULTURE: Culture: NO GROWTH

## 2022-02-23 LAB — PROTEIN AND GLUCOSE, CSF
Glucose, CSF: 96 mg/dL — ABNORMAL HIGH (ref 40–70)
Total  Protein, CSF: 34 mg/dL (ref 15–45)

## 2022-02-23 LAB — CK: Total CK: 299 U/L (ref 49–397)

## 2022-02-23 LAB — MAGNESIUM: Magnesium: 2.2 mg/dL (ref 1.7–2.4)

## 2022-02-23 LAB — PROCALCITONIN: Procalcitonin: 2.96 ng/mL

## 2022-02-23 LAB — GLUCOSE, CAPILLARY
Glucose-Capillary: 192 mg/dL — ABNORMAL HIGH (ref 70–99)
Glucose-Capillary: 181 mg/dL — ABNORMAL HIGH (ref 70–99)
Glucose-Capillary: 247 mg/dL — ABNORMAL HIGH (ref 70–99)

## 2022-02-23 MED ORDER — INSULIN ASPART 100 UNIT/ML IJ SOLN: 0.0000 [IU] | Freq: Every day | INTRAMUSCULAR | Status: DC

## 2022-02-23 MED ORDER — METFORMIN HCL 500 MG PO TABS
500.0000 mg | ORAL_TABLET | Freq: Two times a day (BID) | ORAL | Status: DC
  Administered 2022-02-23 – 2022-02-24 (×3): 500 mg via ORAL
  Filled 2022-02-23 (×3): qty 1

## 2022-02-23 MED ORDER — DEXTROSE 5 % IV SOLN
10.0000 mg/kg | Freq: Two times a day (BID) | INTRAVENOUS | Status: DC
  Administered 2022-02-23 – 2022-02-24 (×2): 680 mg via INTRAVENOUS
  Filled 2022-02-23 (×4): qty 13.6

## 2022-02-23 MED ORDER — INSULIN ASPART 100 UNIT/ML IJ SOLN
0.0000 [IU] | Freq: Three times a day (TID) | INTRAMUSCULAR | Status: DC
  Filled 2022-02-23: qty 1

## 2022-02-23 MED ORDER — HYDROCHLOROTHIAZIDE 12.5 MG PO TABS: 12.5000 mg | ORAL_TABLET | Freq: Every day | ORAL | Status: DC

## 2022-02-23 MED ORDER — LEVOFLOXACIN 250 MG PO TABS: 250.0000 mg | ORAL_TABLET | Freq: Every day | ORAL | 0 refills | Status: DC

## 2022-02-23 MED ORDER — LIDOCAINE HCL 1 % IJ SOLN
5.0000 mL | Freq: Once | INTRAMUSCULAR | Status: DC
  Filled 2022-02-23: qty 5

## 2022-02-23 MED ORDER — POTASSIUM CHLORIDE CRYS ER 20 MEQ PO TBCR
40.0000 meq | EXTENDED_RELEASE_TABLET | Freq: Once | ORAL | Status: AC
  Administered 2022-02-23: 40 meq via ORAL
  Filled 2022-02-23: qty 2

## 2022-02-23 MED ORDER — LOSARTAN POTASSIUM 50 MG PO TABS: 50.0000 mg | ORAL_TABLET | Freq: Every day | ORAL | Status: DC

## 2022-02-23 MED ORDER — LEVOFLOXACIN 250 MG PO TABS
250.0000 mg | ORAL_TABLET | Freq: Every day | ORAL | Status: DC
  Administered 2022-02-23 – 2022-02-24 (×2): 250 mg via ORAL
  Filled 2022-02-23 (×2): qty 1

## 2022-02-23 NOTE — Progress Notes (Signed)
Pharmacy Antibiotic Note ? ?Darren Sharp is a 76 y.o. male admitted on 02/21/2022 with possible viral meningitis.  Pharmacy has been consulted for acyclovir dosing. ? ?-s/p LP 4/7 ? ?Plan: ?Will order Acyclovir 680 mg IV q12h (10 mg/kg/dose)  based on Crcl 42.1 mlmin  ? ?-f/u renal fxn, HSV results ? ? ? ?Height: 5' 8.5" (174 cm) ?Weight: 68 kg (149 lb 14.6 oz) ?IBW/kg (Calculated) : 69.55 ? ?Temp (24hrs), Avg:98.7 ?F (37.1 ?C), Min:97.7 ?F (36.5 ?C), Max:100.7 ?F (38.2 ?C) ? ?Recent Labs  ?Lab 02/21/22 ?0942 02/21/22 ?1012 02/21/22 ?1535 02/22/22 ?8182 02/23/22 ?9937  ?WBC 7.9  --   --  5.7 4.8  ?CREATININE 1.50*  --   --  1.75* 1.48*  ?LATICACIDVEN  --  2.0* 1.7  --   --   ?  ?Estimated Creatinine Clearance: 42.1 mL/min (A) (by C-G formula based on SCr of 1.48 mg/dL (H)).   ? ?Allergies  ?Allergen Reactions  ? Penicillins Anaphylaxis and Swelling  ?  Penicillin injection when patient was a child. Experienced swelling of face, tongue, extremities. Required medical attention  ? ? ?Antimicrobials this admission: ?Vancomycin 4/5 >> 4/6 ?Metronidazole 4/5 >> 4/5 ?Aztreonam 4/5 >> 4/7 ?Levofloxacin 4/7 >> ?acyclovir 4/7 >>   ?     ? ?Dose adjustments this admission: ?  ? ?Microbiology results: ?4/5 BCx: NGx2d ?4/5 UCx: NG  ?  Sputum:    ?4/5 MRSA PCR: neg ?4/7 CSF:  pending ? ?Thank you for allowing pharmacy to be a part of this patient?s care. ? ?Meili Kleckley A ?02/23/2022 4:58 PM ? ?

## 2022-02-23 NOTE — Progress Notes (Signed)
?Progress Note ? ? ?Patient: Darren Sharp QZE:[id number] DOB: June 27, 1946 DOA: 02/21/2022     2 ?DOS: the patient was seen and examined on 02/23/2022 ?  ?Brief hospital course: ?Taken from H&P. ? ?? Darren Sharp is a 76 y.o. male with a past medical history of COPD, diabetes, hypertension, recent diagnosis of Parkinson's disease 1 month ago presents to the emergency department for generalized fatigue and weakness.  According to the patient over the past 3 days he has been feeling very weak and fatigued.  Wife states she measured a temperature last night of 102.  Patient denies any dysuria but wife states the urine yesterday looked almost orange in appearance.  Patient does state he has had slight cough and congestion.  No vomiting or diarrhea." ?? Recent records reviewed, patient has been following with neurology for recent diagnosis of Parkinson's.  Started on Sinemet which he takes 3 times daily.  Also some concern for depression related to recent Parkinson's diagnosis, notes she recent prescription for sertraline 50 mg but patient states that he has not started this medication. ? ?Patient was afebrile on initial presentation to ED, later developed fever at 102.1. ?Had an episode of transient hypoxia which resolved quickly. ?Patient denies any urinary symptoms and UA was negative for any leukocytosis, has some hemoglobinuria but no RBCs.  Also found to have elevated CK. ?Blood cultures remain negative so far. ?Chest x-ray and CT chest was negative for any acute abnormality rather resolution of prior infiltrate and lymph nodes. ?CT abdomen and pelvis was also negative for any concern of acute inflammation or infection.  There was cholelithiasis but no cholecystitis or inflammatory changes in the gallbladder.  Mild retroperitoneal and pericolic gutter stranding was noted likely reactive. ?CT head and MRI brain was also negative for any acute abnormalities. ? ?Neurology was consulted for concern of worsening tremors  intermittently, concern of serotonin syndrome or NMS, per neurology note NMS is very less likely as it is typically seen in Parkinson's patients when they missed several doses of Sinemet or having been on higher doses for a long time, this patient was only recently diagnosed and is on low doses.  He was prescribed SSRI but has not started yet. ? ?No leukocytosis, procalcitonin elevated at 3.28. ?CK elevated at 942 and gamma GT was negative. ? ?Patient was started on IV fluid today due to elevated CK and little worsening of creatinine. ?He was started on aztreonam and vancomycin, also received a dose of Flagyl. ?Discontinuing vancomycin due to AKI and no obvious source yet. ?Penicillin allergies noted. ? ?4/7: Patient had 1 episode of low-grade fever around 100.7 around midnight, blood cultures remain negative.  No other obvious source of infection at this time.  Renal function with improvement in creatinine to 1.48, it was 1.1 in November 2022. ?Neurology decided to proceed with LP after discussing with wife who was very concerned. ?There is a concern of viral meningitis and they started him on acyclovir while waiting for HSV labs. ? ? ? ?Assessment and Plan: ?* Fever of unknown origin (FUO), generalized weakness ?Fever at home reported to be 102, Tylenol administered prior to presentation to the ED, afebrile until this evening (approx 17:30 02/23/22) at which point up to 102.1] ?Lactate initially 2.0, trending down to 1.7,  ?Normal WBC ?No concerns on chest x-ray ?No concerns on CT chest ?Urinalysis no apparent UTI, blood and urine cultures negative ?No abdominal pain ?No headache, no rash, meningitis seems very unlikely  ?Preliminary blood  cultures negative, urine cultures pending.  Procalcitonin at 3.62 ?MRI brain no concerns ?Continue to monitor VS ?CT abdomen was also negative for any acute infection, mild pericolic stranding. ?No abdominal symptoms. ?Had 1 episode of low-grade fever around  midnight. ?-Discontinue vancomycin ?-Continue with aztreonam ?-Continue to monitor ?-Neurology decided to go with LP, preliminary labs with WBC of 11, no clinical sign of meningitis, rest of the labs pending. ?-Neurology decided to start him on acyclovir while waiting for HSV labs ? ?Parkinson's disease ([place]) ?-Continue sinemet  ?Neuro consult placed - appreciate recommendations re: whether his parkinson's can explain any of his symptoms, if another neuro cause might be present, if he should have LP to evaluate fever, if need to adjust medications (patient exhibits significant tremors/body shaking, almost myoclonus, when his sinemet is wearing off, which is not consistent w/ severity of relatively new diagnosis)  ? ?Hypertension ?Blood pressure within normal limit. ?-Holding home dose of HCTZ and losartan due to AKI. ?-Can use as needed hydralazine for systolic above 948  ? ?DM type 2 with diabetic mixed hyperlipidemia ([place]) ?Hold home meds given mild increase creatinine  ?SSI while inpatient  ?Add 5 units of Semglee twice daily ? ?Visual disturbance as complication of stroke ?No new symptoms, MRI brain 02/21/22 no new concerns ?Holding aspirin for possible LP as advised by neurology. ?Holding statin for mild transaminitis  ? ?  ? ?Subjective: Patient was seen and examined today.  No complaints.  He wants to go home.  Wife at bedside who has a lot of questions and appears very anxious about his health. ? ?Physical Exam: ?Vitals:  ? 02/23/22 0617 02/23/22 0816 02/23/22 1130 02/23/22 1608  ?BP: (!) 123/50 (!) 127/51 110/66 (!) 119/47  ?Pulse: 62 69 66 63  ?Resp:  '16 16 16  '$ ?Temp:  98 ?F (36.7 ?C) 98.5 ?F (36.9 ?C) 97.7 ?F (36.5 ?C)  ?TempSrc:  Oral    ?SpO2: 100% 97% 98% 99%  ?Weight:      ?Height:      ? ?General.     In no acute distress. ?Pulmonary.  Lungs clear bilaterally, normal respiratory effort. ?CV.  Regular rate and rhythm, no JVD, rub or murmur. ?Abdomen.  Soft, nontender, nondistended, BS positive. ?CNS.   Alert and oriented x3.  No focal neurologic deficit. ?Extremities.  No edema, no cyanosis, pulses intact and symmetrical. ?Psychiatry.  Judgment and insight appears normal. ? ?Data Reviewed: ?Prior notes, labs and images reviewed ? ?Family Communication: Discussed with wife at bedside ? ?Disposition: ?Status is: Inpatient ?Remains inpatient appropriate because: Severity of illness ? ? Planned Discharge Destination: Home ? ?DVT prophylaxis.  Lovenox ? ?Time spent: 45 minutes ? ?This record has been created using Systems analyst. Errors have been sought and corrected,but may not always be located. Such creation errors do not reflect on the standard of care. ? ?Author: ?Lorella Nimrod, MD ?02/23/2022 4:44 PM ? ?For on call review [email or web address].  ?

## 2022-02-23 NOTE — Plan of Care (Signed)

## 2022-02-23 NOTE — Care Management Important Message (Signed)
Important Message ? ?Patient Details  ?Name: Darren Sharp ?MRN: [id number] ?Date of Birth: Mar 19, 1946 ? ? ?Medicare Important Message Given:  N/A - LOS <3 / Initial given by admissions ? ? ? ? ?[name] ?02/23/2022, 7:27 AM ?

## 2022-02-23 NOTE — Procedures (Signed)
Indication: ? ?Risks of the procedure were dicussed with the patient including post-LP headache, bleeding, infection, weakness/numbness of legs(radiculopathy), death.  The patient/patient's proxy agreed and written consent was obtained.  ? ?The patient was prepped and draped, and using sterile technique a 20 gauge quinke spinal needle was inserted in the L3-4 space. The opening pressure was not measured. Approximately 15 cc of CSF were obtained and sent for analysis. ? ? ?Donnetta Simpers ?[facility] ?Pager Number [phone] ? ? ?

## 2022-02-23 NOTE — Consult Note (Signed)
NEUROLOGY CONSULTATION NOTE  ? ?Date of service: February 23, 2022 ?Patient Name: Darren Sharp ?MRN:  [id number] ?DOB:  11/25/1945 ?Reason for consult: "tremors" ?Requesting Provider: Lorella Nimrod, MD ?_ _ _   _ __   _ __ _ _  __ __   _ __   __ _ ? ?History of Present Illness  ?Darren Sharp is a 76 y.o. male with PMH significant for COPD, DM2, HTN, recent diagnosis of PD on low dose sinemet, cervical disc disease with cervical cord impingement without cord compression, recent diagnosis of depression and prescribed sertraline but not started yet who presented to the ED with generalized fatigue and weakness. He developed fever in the ED with Tmax of 102 with UA not concerning for a UTI, CXR not consistent with a pneumonia. ? ?He was noted to have some worsening tremors and this was discussed with our team and felt to not be serotonin syndrome as he never started SSRI that he was recently prescribed, not felt to be NMS given he is on low dose sinement. He had elevated CK to 900s. ? ?Patient and wife reports that he was fine on Sunday and they went to church. On Monday, he felt bad, was noted to have fevers overnight. Tuesday seemed more confused. He was having bad shivers and difficult to stop him. Reported feeling very cold. He had a fever to 102 at home. He was also reporting some pain in the back og his neck which he has not had in the past. They decided to come to the ED where he was started on Antibiotics and rapidly improved after a couple doses. Also poor po intake over the last few days. ? ?He reports feeling much better, had a fever overnight again. ?  ?ROS  ? ?Constitutional Denies weight loss, fever and chills.   ?HEENT Denies changes in vision and hearing.   ?Respiratory Denies SOB and cough.   ?CV Denies palpitations and CP   ?GI Denies abdominal pain, nausea, vomiting and diarrhea.   ?GU Denies dysuria and urinary frequency.   ?MSK Denies myalgia and joint pain.   ?Skin Denies rash and pruritus.    ?Neurological Denies headache and syncope.   ?Psychiatric Denies recent changes in mood. Denies anxiety and depression.   ? ?Past History  ? ?Past Medical History:  ?Diagnosis Date  ? COPD (chronic obstructive pulmonary disease) ([place])   ? COPD (chronic obstructive pulmonary disease) ([place])   ? Diabetes mellitus without complication ([place])   ? Hypertension   ? ?Past Surgical History:  ?Procedure Laterality Date  ? APPENDECTOMY    ? COLONOSCOPY WITH PROPOFOL N/A 06/05/2018  ? Procedure: COLONOSCOPY WITH PROPOFOL;  Surgeon: Lollie Sails, MD;  Location: [facility] ENDOSCOPY;  Service: Endoscopy;  Laterality: N/A;  ? ESOPHAGOGASTRODUODENOSCOPY (EGD) WITH PROPOFOL N/A 06/05/2018  ? Procedure: ESOPHAGOGASTRODUODENOSCOPY (EGD) WITH PROPOFOL;  Surgeon: Lollie Sails, MD;  Location: [facility] ENDOSCOPY;  Service: Endoscopy;  Laterality: N/A;  ? EYE SURGERY    ? HERNIA REPAIR    ? KNEE CAPSULOTOMY Right   ? ?Family History  ?Problem Relation Age of Onset  ? Cancer Mother   ?     lung  ? Cancer Father   ?     lung cancer   ? ?Social History  ? ?Socioeconomic History  ? Marital status: Married  ?  Spouse name: Not on file  ? Number of children: Not on file  ? Years of education: Not on file  ?  Highest education level: Not on file  ?Occupational History  ? Not on file  ?Tobacco Use  ? Smoking status: Former  ?  Packs/day: 1.00  ?  Years: 49.00  ?  Pack years: 49.00  ?  Types: Cigarettes  ?  Quit date: 01/28/2010  ?  Years since quitting: 12.0  ? Smokeless tobacco: Never  ?Vaping Use  ? Vaping Use: Never used  ?Substance and Sexual Activity  ? Alcohol use: Yes  ?  Alcohol/week: 1.0 standard drink  ?  Types: 1 Cans of beer per week  ?  Comment: 4 or more times a week   ? Drug use: Never  ? Sexual activity: Not on file  ?Other Topics Concern  ? Not on file  ?Social History Narrative  ? Not on file  ? ?Social Determinants of Health  ? ?Financial Resource Strain: Not on file  ?Food Insecurity: Not on file  ?Transportation Needs: Not on  file  ?Physical Activity: Not on file  ?Stress: Not on file  ?Social Connections: Not on file  ? ?Allergies  ?Allergen Reactions  ? Penicillins Anaphylaxis and Swelling  ?  Penicillin injection when patient was a child. Experienced swelling of face, tongue, extremities. Required medical attention  ? ? ?Medications  ? ?Medications Prior to Admission  ?Medication Sig Dispense Refill Last Dose  ? Acetylcysteine (N-ACETYL-L-CYSTEINE PO) Take 1 capsule by mouth See admin instructions. Take 1 capsule ('1000mg'$ ) by mouth every morning and take 1 capsule ('1000mg'$ ) by mouth every night     ? aspirin 81 MG tablet Take 1 tablet (81 mg total) by mouth daily.     ? carbidopa-levodopa (SINEMET IR) 25-100 MG tablet Take 1 tablet by mouth 3 (three) times daily.   02/20/2022 at Unknown  ? hydrochlorothiazide (HYDRODIURIL) 12.5 MG tablet Take 12.5 mg by mouth daily.   02/20/2022 at Unknown  ? Ipratropium-Albuterol (COMBIVENT RESPIMAT) 20-100 MCG/ACT AERS respimat Inhale 2 puffs into the lungs every 6 (six) hours as needed for wheezing or shortness of breath.   Unknown at PRN  ? losartan (COZAAR) 50 MG tablet Take 50 mg by mouth daily.   02/20/2022 at Unknown  ? metFORMIN (GLUCOPHAGE) 1000 MG tablet Take 1,000 mg by mouth 2 (two) times daily.   02/20/2022 at Unknown  ? Multiple Vitamin (MULTIVITAMIN) tablet Take 1 tablet by mouth daily.     ? OVER THE COUNTER MEDICATION Take 1 capsule by mouth daily with lunch. **[employer] - ClearLungs Extra Strength**     ? rosuvastatin (CRESTOR) 10 MG tablet Take 10 mg by mouth every Monday, Wednesday, and Friday.     ? umeclidinium-vilanterol (ANORO ELLIPTA) 62.5-25 MCG/ACT AEPB Inhale 1 puff into the lungs daily.   02/20/2022 at Unknown  ? vitamin E 180 MG (400 UNITS) capsule Take 400 Units by mouth daily.     ? levofloxacin (LEVAQUIN) 500 MG tablet Take 500 mg by mouth daily. (x7 days)     ? sertraline (ZOLOFT) 50 MG tablet Take 50 mg by mouth daily.     ?  ? ?Vitals  ? ?Vitals:  ? 02/22/22 2346  02/23/22 0135 02/23/22 0617 02/23/22 0816  ?BP: (!) 119/45  (!) 123/50 (!) 127/51  ?Pulse: 79  62 69  ?Resp:    16  ?Temp: (!) 100.7 ?F (38.2 ?C) 98.5 ?F (36.9 ?C)  98 ?F (36.7 ?C)  ?TempSrc:      ?SpO2: 96%  100% 97%  ?Weight:      ?Height:      ?  ? ?  Body mass index is 22.46 kg/m?. ? ?Physical Exam  ? ?General: Laying comfortably in bed; in no acute distress.  ?HENT: Normal oropharynx and mucosa. Normal external appearance of ears and nose.  ?Neck: Supple, no pain or tenderness  ?CV: No JVD. No peripheral edema.  ?Pulmonary: Symmetric Chest rise. Normal respiratory effort.  ?Abdomen: Soft to touch, non-tender.  ?Ext: No cyanosis, edema, or deformity  ?Skin: No rash. Normal palpation of skin.   ?Musculoskeletal: Normal digits and nails by inspection. No clubbing.  ? ?Neurologic Examination  ?Mental status/Cognition: Alert, oriented to self, place, month and year, good attention.  ?Speech/language: Fluent, comprehension intact, object naming intact, repetition intact.  ?Cranial nerves:  ? CN II Pupils equal and reactive to light, no VF deficits   ? CN III,IV,VI EOM intact, no gaze preference or deviation, no nystagmus   ? CN V normal sensation in V1, V2, and V3 segments bilaterally   ? CN VII no asymmetry, no nasolabial fold flattening   ? CN VIII normal hearing to speech   ? CN IX & X normal palatal elevation, no uvular deviation   ? CN XI 5/5 head turn and 5/5 shoulder shrug bilaterally   ? CN XII midline tongue protrusion  ? ?Motor:  ?Muscle bulk: normal, tone normal, pronator drift none tremor none ?Mvmt Root Nerve  Muscle Right Left Comments  ?SA C5/6 Ax Deltoid 5 5   ?EF C5/6 Mc Biceps 5 5   ?EE C6/7/8 Rad Triceps 5 5   ?WF C6/7 Med FCR     ?WE C7/8 PIN ECU     ?F Ab C8/T1 U ADM/FDI 5 5   ?HF L1/2/3 Fem Illopsoas 5 5   ?KE L2/3/4 Fem Quad 5 5   ?DF L4/5 D Peron Tib Ant 5 5   ?PF S1/2 Tibial Grc/Sol 5 5   ? ?Reflexes: ? Right Left Comments  ?Pectoralis     ? Biceps (C5/6) 2 2   ?Brachioradialis (C5/6) 2 2   ?  Triceps (C6/7) 2 2   ? Patellar (L3/4) 2 2   ? Achilles (S1)     ? Hoffman     ? Plantar     ?Jaw jerk   ? ?Sensation: ? Light touch Intact throughout  ? Pin prick   ? Temperature   ? Vibration   ?Proprioception   ? ?C

## 2022-02-23 NOTE — Discharge Summary (Signed)
?Physician Discharge Summary ?  ?Patient: Darren Sharp MRN: [id number] DOB: 1946-10-04  ?Admit date:     02/21/2022  ?Discharge date: 02/24/22  ?Discharge Physician: Lorella Nimrod  ? ?PCP: Rusty Aus, MD  ? ?Recommendations at discharge:  ?Please obtain and CBC and BMP in 1 week ?Follow-up on pending lab results on CSF ?Follow-up with primary care provider in 1 week ? ?Discharge Diagnoses: ?Principal Problem: ?  Fever of unknown origin (FUO), generalized weakness ?Active Problems: ?  Parkinson's disease ([place]) ?  Hypertension ?  DM type 2 with diabetic mixed hyperlipidemia ([place]) ?  Visual disturbance as complication of stroke ? ?Hospital Course: ?Taken from H&P. ? ?Darren Sharp is a 76 y.o. male with a past medical history of COPD, diabetes, hypertension, recent diagnosis of Parkinson's disease 1 month ago presents to the emergency department for generalized fatigue and weakness.  According to the patient over the past 3 days he has been feeling very weak and fatigued.  Wife states she measured a temperature last night of 102.  Patient denies any dysuria but wife states the urine yesterday looked almost orange in appearance.  Patient does state he has had slight cough and congestion.  No vomiting or diarrhea." ?Recent records reviewed, patient has been following with neurology for recent diagnosis of Parkinson's.  Started on Sinemet which he takes 3 times daily.  Also some concern for depression related to recent Parkinson's diagnosis, notes she recent prescription for sertraline 50 mg but patient states that he has not started this medication. ? ?Patient was afebrile on initial presentation to ED, later developed fever at 102.1. ?Had an episode of transient hypoxia which resolved quickly. ?Patient denies any urinary symptoms and UA was negative for any leukocytosis, has some hemoglobinuria but no RBCs.  Also found to have elevated CK. ?Blood cultures remain negative so far. ?Chest x-ray and CT chest was negative  for any acute abnormality rather resolution of prior infiltrate and lymph nodes. ?CT abdomen and pelvis was also negative for any concern of acute inflammation or infection.  There was cholelithiasis but no cholecystitis or inflammatory changes in the gallbladder.  Mild retroperitoneal and pericolic gutter stranding was noted likely reactive. ?CT head and MRI brain was also negative for any acute abnormalities. ? ?Neurology was consulted for concern of worsening tremors intermittently, concern of serotonin syndrome or NMS, per neurology note NMS is very less likely as it is typically seen in Parkinson's patients when they missed several doses of Sinemet or having been on higher doses for a long time, this patient was only recently diagnosed and is on low doses.  He was prescribed SSRI but has not started yet. ? ?No leukocytosis, procalcitonin elevated at 3.28>>2.96>1.57 ?CK elevated at 942 and normalized next day with IV fluid. ? gamma GT was negative. ? ?Patient was started on IV fluid today due to elevated CK and little worsening of creatinine. ?He was started on aztreonam and vancomycin, also received a dose of Flagyl. ?Discontinuing vancomycin due to AKI and no obvious source yet. ?Penicillin allergies noted. ? ?4/7: Patient had 1 episode of low-grade fever around 100.7 around midnight, blood cultures remain negative.  No other obvious source of infection at this time.  Renal function with improvement in creatinine to 1.48, it was 1.1 in November 2022. ?Neurology decided to proceed with LP after discussing with wife who was very concerned. ?There is a concern of viral meningitis and they started him on acyclovir while waiting for HSV labs. ? ?  4/8: Patient appears to be at baseline.  Remained afebrile.  HSV PCR came back negative.  So acyclovir was discontinued.  Preliminary culture of CSF is negative.  Normal protein and mildly elevated blood glucose with serum to blood glucose ratio of 0.5 which is within  normal limit.[name] ?Neurology also sent it few more labs which include angiotensin-converting enzyme, RPR, and HIV antibody, cryptococcal antigen, enterovirus PCR and the results are pending. ?Patient is being discharged on 3 more days of Levaquin at a reduced dose of 250 mg daily as recommended by her age. ?We advised him to keep holding losartan and HCTZ as blood pressure is well controlled at this time and he can follow-up with his PCP for lab results and further management.  PCP can restart antihypertensives as needed. ? ?Patient will continue with the rest of his home medications except mentioned above and will follow-up with his provider. ? ? ?Assessment and Plan: ?* Fever of unknown origin (FUO), generalized weakness ?Fever at home reported to be 102, Tylenol administered prior to presentation to the ED, afebrile until this evening (approx 17:30 02/23/22) at which point up to 102.1] ?Lactate initially 2.0, trending down to 1.7,  ?Normal WBC ?No concerns on chest x-ray ?No concerns on CT chest ?Urinalysis no apparent UTI, blood and urine cultures negative ?No abdominal pain ?No headache, no rash, meningitis seems very unlikely  ?Preliminary blood cultures negative, urine cultures pending.  Procalcitonin at 3.62 ?MRI brain no concerns ?Continue to monitor VS ?CT abdomen was also negative for any acute infection, mild pericolic stranding. ?No abdominal symptoms. ?Had 1 episode of low-grade fever around midnight. ?-Discontinue vancomycin ?-Continue with aztreonam ?-Continue to monitor ?-Neurology decided to go with LP, preliminary labs with WBC of 11, no clinical sign of meningitis, rest of the labs pending. ?-Neurology decided to start him on acyclovir while waiting for HSV labs ? ?Parkinson's disease ([place]) ?-Continue sinemet  ?Neuro consult placed - appreciate recommendations re: whether his parkinson's can explain any of his symptoms, if another neuro cause might be present, if he should have LP to evaluate fever,  if need to adjust medications (patient exhibits significant tremors/body shaking, almost myoclonus, when his sinemet is wearing off, which is not consistent w/ severity of relatively new diagnosis)  ? ?Hypertension ?Blood pressure within normal limit. ?-Holding home dose of HCTZ and losartan due to AKI. ?-Can use as needed hydralazine for systolic above 101  ? ?DM type 2 with diabetic mixed hyperlipidemia ([place]) ?Hold home meds given mild increase creatinine  ?SSI while inpatient  ?Add 5 units of Semglee twice daily ? ?Visual disturbance as complication of stroke ?No new symptoms, MRI brain 02/21/22 no new concerns ?Holding aspirin for possible LP as advised by neurology. ?Holding statin for mild transaminitis  ? ? ?Consultants: Neurology ?Procedures performed: LP ?Disposition: Home ?Diet recommendation:  ?Discharge Diet Orders (From admission, onward)  ? ?  Start     Ordered  ? 02/23/22 0000  Diet - low sodium heart healthy       ? 02/23/22 1249  ? ?  ?  ? ?  ? ?Cardiac diet ?DISCHARGE MEDICATION: ?Allergies as of 02/24/2022   ? ?   Reactions  ? Penicillins Anaphylaxis, Swelling  ? Penicillin injection when patient was a child. Experienced swelling of face, tongue, extremities. Required medical attention  ? ?  ? ?  ?Medication List  ?  ? ?STOP taking these medications   ? ?sertraline 50 MG tablet ?Commonly known as: ZOLOFT ?  ? ?  ? ?  TAKE these medications   ? ?aspirin 81 MG tablet ?Take 1 tablet (81 mg total) by mouth daily. ?  ?carbidopa-levodopa 25-100 MG tablet ?Commonly known as: SINEMET IR ?Take 1 tablet by mouth 3 (three) times daily. ?  ?Combivent Respimat 20-100 MCG/ACT Aers respimat ?Generic drug: Ipratropium-Albuterol ?Inhale 2 puffs into the lungs every 6 (six) hours as needed for wheezing or shortness of breath. ?  ?hydrochlorothiazide 12.5 MG tablet ?Commonly known as: HYDRODIURIL ?Take 1 tablet (12.5 mg total) by mouth daily. Hold for next 3 days. ?What changed: additional instructions ?  ?levofloxacin  500 MG tablet ?Commonly known as: LEVAQUIN ?Take 0.5 tablets (250 mg total) by mouth daily for 3 days. (x7 days) ?What changed: how much to take ?  ?losartan 50 MG tablet ?Commonly known as: COZAAR ?Take 1

## 2022-02-23 NOTE — Assessment & Plan Note (Signed)
Fever at home reported to be 102, Tylenol administered prior to presentation to the ED, afebrile until this evening (approx 17:30 02/23/22) at which point up to 102.1] ?Lactate initially 2.0, trending down to 1.7,  ?Normal WBC ?No concerns on chest x-ray ?No concerns on CT chest ?Urinalysis no apparent UTI, blood and urine cultures negative ?No abdominal pain ?No headache, no rash, meningitis seems very unlikely  ?Preliminary blood cultures negative, urine cultures pending.  Procalcitonin at 3.62 ?MRI brain no concerns ?Continue to monitor VS ?CT abdomen was also negative for any acute infection, mild pericolic stranding. ?No abdominal symptoms. ?Had 1 episode of low-grade fever around midnight. ?-Discontinue vancomycin ?-Continue with aztreonam ?-Continue to monitor ?-Neurology decided to go with LP, preliminary labs with WBC of 11, no clinical sign of meningitis, rest of the labs pending. ?-Neurology decided to start him on acyclovir while waiting for HSV labs ?

## 2022-02-23 NOTE — Plan of Care (Signed)
Trason had a temp of 100.7. BP 119/45, HR 79. Last blood cultures and UA on 4/5. Antibiotics ongoing. He did report tremors and chills 64mn prior to fevering which is normal fever presentation for [place].  ? ?Updated BSharion Settler NP. Orders placed for flutter valve and incentive spirometer. PRN ibuprofen administered.  ?

## 2022-02-23 NOTE — TOC Progression Note (Signed)
Transition of Care (TOC) - Progression Note  ? ? ?Patient Details  ?Name: Darren Sharp ?MRN: [id number] ?Date of Birth: 08/14/1946 ? ?Transition of Care (TOC) CM/SW Contact  ?Pete Pelt, RN ?Phone Number: ?02/23/2022, 11:28 AM ? ?Clinical Narrative:   Anticipated discharge today, [facility] will accept for [facility].  Spouse will transport patient home. ? ? ? ?Expected Discharge Plan:  (pending PT eval) ?Barriers to Discharge: Continued Medical Work up ? ?Expected Discharge Plan and Services ?Expected Discharge Plan:  (pending PT eval) ?  ?Discharge Planning Services: CM Consult ?Post Acute Care Choice:  (TBD) ?Living arrangements for the past 2 months: [place] ?                ?  ?  ?  ?  ?  ?  ?  ?  ?  ?  ? ? ?Social Determinants of Health (SDOH) Interventions ?  ? ?Readmission Risk Interventions ? ?  02/22/2022  ?  1:53 PM  ?Readmission Risk Prevention Plan  ?Post Dischage Appt Complete  ?Medication Screening Complete  ?Transportation Screening Complete  ? ? ?

## 2022-02-24 DIAGNOSIS — R531 Weakness: Secondary | ICD-10-CM | POA: Diagnosis not present

## 2022-02-24 DIAGNOSIS — R509 Fever, unspecified: Secondary | ICD-10-CM | POA: Diagnosis not present

## 2022-02-24 LAB — BASIC METABOLIC PANEL
Anion gap: 10 (ref 5–15)
BUN: 36 mg/dL — ABNORMAL HIGH (ref 8–23)
CO2: 20 mmol/L — ABNORMAL LOW (ref 22–32)
Calcium: 8.8 mg/dL — ABNORMAL LOW (ref 8.9–10.3)
Chloride: 107 mmol/L (ref 98–111)
Creatinine, Ser: 1.25 mg/dL — ABNORMAL HIGH (ref 0.61–1.24)
GFR, Estimated: >60 mL/min (ref >60)
Glucose, Bld: 199 mg/dL — ABNORMAL HIGH (ref 70–99)
Potassium: 3.5 mmol/L (ref 3.5–5.1)
Sodium: 137 mmol/L (ref 135–145)

## 2022-02-24 LAB — GLUCOSE, CAPILLARY
Glucose-Capillary: 181 mg/dL — ABNORMAL HIGH (ref 70–99)
Glucose-Capillary: 185 mg/dL — ABNORMAL HIGH (ref 70–99)

## 2022-02-24 LAB — HEMOGLOBIN A1C
Hgb A1c MFr Bld: 6 % — ABNORMAL HIGH (ref 4.8–5.6)
Mean Plasma Glucose: 125.5 mg/dL

## 2022-02-24 LAB — PROCALCITONIN: Procalcitonin: 1.57 ng/mL

## 2022-02-24 LAB — HSV 1/2 PCR, CSF
HSV-1 DNA: NEGATIVE
HSV-2 DNA: NEGATIVE

## 2022-02-24 MED ORDER — LEVOFLOXACIN 500 MG PO TABS
250.0000 mg | ORAL_TABLET | Freq: Every day | ORAL | 0 refills | Status: AC
Start: 2022-02-24 — End: 2022-02-27

## 2022-02-24 NOTE — Progress Notes (Signed)
Subjective: ?Wife reports confusion is completely resolved. ? ?Exam: ?Vitals:  ? 02/24/22 0732 02/24/22 1113  ?BP: (!) 126/54 (!) 131/59  ?Pulse: 62 72  ?Resp: 17 18  ?Temp: 98.4 ?F (36.9 ?C) 98.2 ?F (36.8 ?C)  ?SpO2: 97% 98%  ? ?Gen: In bed, NAD ?Resp: non-labored breathing, no acute distress ? ? ?Neuro: ?MS: Awake, alert, oriented ?CN:?  Mild left upper field cut(patient reports baseline), EOMI, face symmetric ?Motor: No drift in either upper extremity ? ?Pertinent Labs: ?Cr 1.75(4/6) > 1.25 today ? ?Impression: 76 year old male who presented with several days of malaise and confusion with fevers of unclear etiology with mild pleocytosis on LP.  He does not have any signs of headache, so low suspicion for any type of meningitis.  His improvement would argue against herpes encephalitis, and I discussed options with the patient and his wife regarding possibly discontinuing acyclovir prior to getting the HSV PCR panel back versus continuing it.  I think the risk would be very low of discontinuing it, but I cannot say it is zero.  After having this discussion with the patient and his wife, the patient would favor continuing treatment until HSV has been ruled out. ? ?I will also send RPR, HIV, angiotensin-converting enzyme. ? ?Recommendations: ?1) HIV, RPR, ACE ?2) can discontinue acyclovir once HSV PCR is negative.  ? ?Roland Rack, MD ?[facility] ?[phone] ? ?If 7pm- 7am, please page neurology on call as listed in [employer]. ? ?

## 2022-02-24 NOTE — Progress Notes (Signed)
Discharge instructions provided to patient. All medications, follow up appointments, and discharge instructions discussed. IV out. Monitor off CCMD notified. Discharging home with family. ?Era Bumpers, RN  ?

## 2022-02-25 LAB — RPR: RPR Ser Ql: NONREACTIVE

## 2022-02-25 LAB — ANGIOTENSIN CONVERTING ENZYME: Angiotensin-Converting Enzyme: 60 U/L (ref 14–82)

## 2022-02-26 LAB — CULTURE, BLOOD (ROUTINE X 2)
Culture: NO GROWTH
Culture: NO GROWTH

## 2022-02-26 LAB — CSF CULTURE W GRAM STAIN
Culture: NO GROWTH
Gram Stain: NONE SEEN

## 2022-02-26 LAB — HIV ANTIBODY (ROUTINE TESTING W REFLEX): HIV Screen 4th Generation wRfx: NONREACTIVE

## 2022-02-27 DIAGNOSIS — I700 Atherosclerosis of aorta: Secondary | ICD-10-CM | POA: Diagnosis not present

## 2022-02-27 DIAGNOSIS — R509 Fever, unspecified: Secondary | ICD-10-CM | POA: Diagnosis not present

## 2022-02-27 DIAGNOSIS — J431 Panlobular emphysema: Secondary | ICD-10-CM | POA: Diagnosis not present

## 2022-02-27 DIAGNOSIS — G9340 Encephalopathy, unspecified: Secondary | ICD-10-CM | POA: Diagnosis not present

## 2022-02-27 DIAGNOSIS — N179 Acute kidney failure, unspecified: Secondary | ICD-10-CM | POA: Diagnosis not present

## 2022-02-27 LAB — QUANTIFERON-TB GOLD PLUS: QuantiFERON-TB Gold Plus: NEGATIVE

## 2022-02-27 LAB — QUANTIFERON-TB GOLD PLUS (RQFGPL)
QuantiFERON Mitogen Value: 4.53 IU/mL
QuantiFERON Nil Value: 2.96 IU/mL
QuantiFERON TB1 Ag Value: 2.64 IU/mL
QuantiFERON TB2 Ag Value: 2.8 IU/mL

## 2022-02-28 DIAGNOSIS — H353131 Nonexudative age-related macular degeneration, bilateral, early dry stage: Secondary | ICD-10-CM | POA: Diagnosis not present

## 2022-03-09 DIAGNOSIS — H53461 Homonymous bilateral field defects, right side: Secondary | ICD-10-CM | POA: Diagnosis not present

## 2022-03-09 DIAGNOSIS — Z8661 Personal history of infections of the central nervous system: Secondary | ICD-10-CM | POA: Diagnosis not present

## 2022-03-09 DIAGNOSIS — G9340 Encephalopathy, unspecified: Secondary | ICD-10-CM | POA: Diagnosis not present

## 2022-03-09 DIAGNOSIS — H353211 Exudative age-related macular degeneration, right eye, with active choroidal neovascularization: Secondary | ICD-10-CM | POA: Diagnosis not present

## 2022-03-09 DIAGNOSIS — Z961 Presence of intraocular lens: Secondary | ICD-10-CM | POA: Diagnosis not present

## 2022-03-12 DIAGNOSIS — H353211 Exudative age-related macular degeneration, right eye, with active choroidal neovascularization: Secondary | ICD-10-CM | POA: Diagnosis not present

## 2022-03-30 DIAGNOSIS — G9520 Unspecified cord compression: Secondary | ICD-10-CM | POA: Diagnosis not present

## 2022-03-30 DIAGNOSIS — I10 Essential (primary) hypertension: Secondary | ICD-10-CM | POA: Diagnosis not present

## 2022-03-30 DIAGNOSIS — G20 Parkinson's disease: Secondary | ICD-10-CM | POA: Diagnosis not present

## 2022-03-30 DIAGNOSIS — E1169 Type 2 diabetes mellitus with other specified complication: Secondary | ICD-10-CM | POA: Diagnosis not present

## 2022-03-30 DIAGNOSIS — H53411 Scotoma involving central area, right eye: Secondary | ICD-10-CM | POA: Diagnosis not present

## 2022-03-30 DIAGNOSIS — G939 Disorder of brain, unspecified: Secondary | ICD-10-CM | POA: Diagnosis not present

## 2022-03-30 DIAGNOSIS — E782 Mixed hyperlipidemia: Secondary | ICD-10-CM | POA: Diagnosis not present

## 2022-03-30 DIAGNOSIS — Z8661 Personal history of infections of the central nervous system: Secondary | ICD-10-CM | POA: Diagnosis not present

## 2022-04-17 DIAGNOSIS — H353211 Exudative age-related macular degeneration, right eye, with active choroidal neovascularization: Secondary | ICD-10-CM | POA: Diagnosis not present

## 2022-04-23 DIAGNOSIS — I10 Essential (primary) hypertension: Secondary | ICD-10-CM | POA: Diagnosis not present

## 2022-04-23 DIAGNOSIS — E1169 Type 2 diabetes mellitus with other specified complication: Secondary | ICD-10-CM | POA: Diagnosis not present

## 2022-04-23 DIAGNOSIS — E782 Mixed hyperlipidemia: Secondary | ICD-10-CM | POA: Diagnosis not present

## 2022-04-30 DIAGNOSIS — E782 Mixed hyperlipidemia: Secondary | ICD-10-CM | POA: Diagnosis not present

## 2022-04-30 DIAGNOSIS — E538 Deficiency of other specified B group vitamins: Secondary | ICD-10-CM | POA: Diagnosis not present

## 2022-04-30 DIAGNOSIS — Z125 Encounter for screening for malignant neoplasm of prostate: Secondary | ICD-10-CM | POA: Diagnosis not present

## 2022-04-30 DIAGNOSIS — E1169 Type 2 diabetes mellitus with other specified complication: Secondary | ICD-10-CM | POA: Diagnosis not present

## 2022-05-14 DIAGNOSIS — J431 Panlobular emphysema: Secondary | ICD-10-CM | POA: Diagnosis not present

## 2022-05-14 DIAGNOSIS — G20 Parkinson's disease: Secondary | ICD-10-CM | POA: Diagnosis not present

## 2022-05-14 DIAGNOSIS — Z0000 Encounter for general adult medical examination without abnormal findings: Secondary | ICD-10-CM | POA: Diagnosis not present

## 2022-05-14 DIAGNOSIS — E782 Mixed hyperlipidemia: Secondary | ICD-10-CM | POA: Diagnosis not present

## 2022-05-14 DIAGNOSIS — E1169 Type 2 diabetes mellitus with other specified complication: Secondary | ICD-10-CM | POA: Diagnosis not present

## 2022-05-29 DIAGNOSIS — H353211 Exudative age-related macular degeneration, right eye, with active choroidal neovascularization: Secondary | ICD-10-CM | POA: Diagnosis not present

## 2022-07-03 DIAGNOSIS — H353211 Exudative age-related macular degeneration, right eye, with active choroidal neovascularization: Secondary | ICD-10-CM | POA: Diagnosis not present

## 2022-07-09 DIAGNOSIS — G939 Disorder of brain, unspecified: Secondary | ICD-10-CM | POA: Diagnosis not present

## 2022-07-09 DIAGNOSIS — G20 Parkinson's disease: Secondary | ICD-10-CM | POA: Diagnosis not present

## 2022-07-09 DIAGNOSIS — R42 Dizziness and giddiness: Secondary | ICD-10-CM | POA: Diagnosis not present

## 2022-07-09 DIAGNOSIS — E782 Mixed hyperlipidemia: Secondary | ICD-10-CM | POA: Diagnosis not present

## 2022-07-09 DIAGNOSIS — I10 Essential (primary) hypertension: Secondary | ICD-10-CM | POA: Diagnosis not present

## 2022-07-09 DIAGNOSIS — E1169 Type 2 diabetes mellitus with other specified complication: Secondary | ICD-10-CM | POA: Diagnosis not present

## 2022-07-09 DIAGNOSIS — G9520 Unspecified cord compression: Secondary | ICD-10-CM | POA: Diagnosis not present

## 2022-07-09 DIAGNOSIS — R453 Demoralization and apathy: Secondary | ICD-10-CM | POA: Diagnosis not present

## 2022-07-13 DIAGNOSIS — H903 Sensorineural hearing loss, bilateral: Secondary | ICD-10-CM | POA: Diagnosis not present

## 2022-07-17 DIAGNOSIS — G20 Parkinson's disease: Secondary | ICD-10-CM | POA: Diagnosis not present

## 2022-08-07 DIAGNOSIS — H353211 Exudative age-related macular degeneration, right eye, with active choroidal neovascularization: Secondary | ICD-10-CM | POA: Diagnosis not present

## 2022-08-17 DIAGNOSIS — M4802 Spinal stenosis, cervical region: Secondary | ICD-10-CM | POA: Diagnosis not present

## 2022-08-17 DIAGNOSIS — M542 Cervicalgia: Secondary | ICD-10-CM | POA: Diagnosis not present

## 2022-08-17 DIAGNOSIS — M4712 Other spondylosis with myelopathy, cervical region: Secondary | ICD-10-CM | POA: Diagnosis not present

## 2022-09-17 ENCOUNTER — Encounter (INDEPENDENT_AMBULATORY_CARE_PROVIDER_SITE_OTHER): Payer: Self-pay

## 2022-10-16 ENCOUNTER — Other Ambulatory Visit: Payer: Self-pay | Admitting: *Deleted

## 2022-10-16 DIAGNOSIS — Z87891 Personal history of nicotine dependence: Secondary | ICD-10-CM

## 2022-10-16 DIAGNOSIS — Z122 Encounter for screening for malignant neoplasm of respiratory organs: Secondary | ICD-10-CM

## 2022-11-05 ENCOUNTER — Encounter: Payer: Self-pay | Admitting: Neurology

## 2022-11-06 NOTE — Progress Notes (Unsigned)
Assessment/Plan:    Parkinsonism, probable idiopathic pd -Biopsy for alpha-synuclein was positive at [facility] clinic.  It is unclear what biopsy sites specifically were positive.  Given this, doubtful this is FTD -Change timing of levodopa and move dosages closer together.  He should take carbidopa/levodopa 25/100, 2 tablets at 8 AM/noon/4 PM.  I actually wonder if we will be able to decrease the dosage.  Not convinced that he needs so much but we will see what happens when we do this first. -Referral for [facility] -Schedule neurocognitive testing.  Spoke with Dr. Melvyn Novas about this patient -We discussed that it used to be thought that levodopa would increase risk of melanoma but now it is believed that Parkinsons itself likely increases risk of melanoma. he is to get regular skin checks. -discussed importance of safe, CV exercise  2.  Mood change  -start lexapro, 10 mg daily.  R/b/se discussed     Subjective:   Darren Sharp was seen today in the movement disorders clinic for neurologic consultation at the request of Vladimir Crofts, MD.  The consultation is for the evaluation of Parkinson's disease.  Medical records made available to me are reviewed.  Patient first saw Dr. Manuella Ghazi for consultation January 19, 2022.  Symptom wise dizziness, near syncope, bilateral hand tremor (resting and action) and patient was diagnosed with Parkinson's disease and started on carbidopa/levodopa 25/100, 1 tablet 3 times per day.  It was felt that dizziness was likely related to BPPV.  Patient was also complaining of a large central scotoma in the right eye, felt due to history of detached retina x 2.  MRI brain was unrevealing.  It was recommended that patient have skin biopsy for alpha-synuclein, which was completed on July 17, 2022.  This apparently demonstrated evidence of alpha-synuclein (unclear which biopsy site) counseling was recommended because of difficulty adjusting to diagnosis and periods of apathy and  mood swings.  Patient was prescribed Depakote for this by neurology.  Wife states that he took it for a month at 125 mg and pt was more aggressive and poor focus and they d/c it.    Separate from the above, the patient was in the hospital in April, after presenting with weakness and fever at home and somewhat more confused with increasing depression.  Patient was placed on antibiotics in the emergency room and patient rapidly improved.  There was no nuchal rigidity on examination.  Spinal tap was done and there was mild pleocytosis (CSF white count 11), normal protein, red blood cells 0, negative culture, negative HSV PCR.  HIV was negative.  RPR was negative.  ACE was normal.  Looking back, Wife states today that mood change was the first sx.  Wife states that she noted anger and rage about 1 year + ago (which has gotten better).    Pt thinks that hand tremor was the first sx.  Pt isn't sure if it was both hands or just the R hand since he was using the mouse with the R hand.  Pt also noted when he would look up, he would get dizzy and lightheaded but no diplopia.    Pt is s/p c-spine sx in oct and did well with that with Dr. Arnoldo Morale.    Current movement d/o meds: carbidopa/levodopa 25/100, 2 po tid (8am/3pm/11pm) - wife states that it was helping but he feels that in the afternoon he will shake before the 3 pm pill (medication last increased in nov per wife)  Specific Symptoms:  Tremor: Yes.  , bilateral UE (mostly action per pt); none in the LE per pt/wife Family hx of similar:  No. Voice: soft Sleep: trouble getting to sleep  Vivid Dreams:  No.  Acting out dreams:  No. Wet Pillows: No. Postural symptoms:  Yes.    Falls?  No. But has near falls Bradykinesia symptoms: shuffling gait (the phone picked up his "unstable feet" and "shuffle" before he did) Loss of smell:  No. Loss of taste:  No. Urinary Incontinence:  No. Difficulty Swallowing:  rarely and attributes to recent c-spine sx  (went in anterior) Handwriting, micrographia: No., always written small and it is shaky Trouble with ADL's:  No.  Trouble buttoning clothing: No. Memory changes:  pt reports trouble with names; wife reports trouble with multitasking; no hyperreligious/hypersexual; wife states that pt is very impatient now and sometimes aggressive but now that they have discussed it, he is better at trying to control it Hallucinations:  No.  visual distortions: No. N/V:  No. Diplopia:  No.  Patient had MRI brain both in March and April 2023.  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 Experienced swelling of face, tongue, extremities. Required medical attention    CURRENT MEDICATIONS:  Current Meds  Medication Sig   aspirin 81 MG tablet Take 1 tablet (81 mg total) by mouth daily.   carbidopa-levodopa (SINEMET IR) 25-100 MG tablet Take 1 tablet by mouth 3 (three) times daily.   Ipratropium-Albuterol (COMBIVENT RESPIMAT) 20-100 MCG/ACT AERS respimat Inhale 2 puffs into the lungs every 6 (six) hours as needed for wheezing or shortness of breath.   Multiple Vitamin (MULTIVITAMIN) tablet Take 1 tablet by mouth daily.   OVER THE COUNTER MEDICATION Take 1 capsule by mouth daily with lunch. **[employer] - ClearLungs Extra Strength**   rosuvastatin (CRESTOR) 10 MG tablet Take 10 mg by mouth every Monday, Wednesday, and Friday.   umeclidinium-vilanterol (ANORO ELLIPTA) 62.5-25 MCG/ACT AEPB Inhale 1 puff into the lungs daily.   vitamin E 180 MG (400 UNITS) capsule Take 400 Units by mouth daily.     Objective:   VITALS:   Vitals:   11/07/22 1002  BP: (!) 150/73  Pulse: 76  SpO2: 96%  Weight: 149 lb 9.6 oz (67.9 kg)    GEN:  The patient appears stated age and is in NAD. HEENT:  Normocephalic, atraumatic.   The mucous membranes are moist. The superficial temporal arteries are without ropiness or tenderness. CV:  RRR Lungs:  CTAB Neck/HEME:  There are no carotid bruits bilaterally.  Neurological examination:  Orientation: The patient is alert and oriented x3.  Cranial nerves: There is good facial symmetry. Extraocular muscles are intact. The visual fields are full to confrontational testing. The speech is fluent and clear. Soft palate rises symmetrically and there is no tongue deviation. Hearing is intact to conversational tone. Sensation: Sensation is intact to light and pinprick throughout (facial, trunk, extremities). Vibration is intact at the bilateral big toe. There is no extinction with double simultaneous stimulation. There is no sensory dermatomal level identified. Motor: Strength is 5/5 in the bilateral upper and lower extremities.   Shoulder shrug is equal and symmetric.  There is no pronator drift. Deep tendon reflexes: Deep tendon reflexes are 2/4 at the bilateral biceps, triceps, brachioradialis, patella and  achilles. Plantar responses are downgoing bilaterally.  Movement examination: Tone: There is mild to mod increased tone in the RUE Abnormal movements: there is rare RUE rest tremor Coordination:  There is no decremation with RAM's, with any form of RAMS, including alternating supination and pronation of the forearm, hand opening and closing, finger taps, heel taps and toe taps.  Gait and Station: The patient has min difficulty arising out of a deep-seated chair without the use of the hands. The patient's stride length is slightly decreased.  He has camptocormia.   I have reviewed and interpreted the following labs independently   Chemistry      Component Value Date/Time   NA 137 02/24/2022 0509   NA 143 02/04/2017 0922   K 3.5 02/24/2022 0509   CL 107 02/24/2022 0509   CO2 20 (L) 02/24/2022 0509   BUN 36 (H) 02/24/2022 0509   BUN 22 02/04/2017 0922   CREATININE 1.25 (H)  02/24/2022 0509      Component Value Date/Time   CALCIUM 8.8 (L) 02/24/2022 0509   ALKPHOS 59 02/21/2022 0942   AST 91 (H) 02/21/2022 0942   ALT 57 (H) 02/21/2022 0942   BILITOT 1.0 02/21/2022 0942   BILITOT 0.5 02/04/2017 0922      No results found for: "TSH" Lab Results  Component Value Date   WBC 4.8 02/23/2022   HGB 11.7 (L) 02/23/2022   HCT 34.2 (L) 02/23/2022   MCV 90.7 02/23/2022   PLT 151 02/23/2022     Total time spent on today's visit was 70 minutes, including both face-to-face time and nonface-to-face time.  Time included that spent on review of records (prior notes available to me/labs/imaging if pertinent), discussing treatment and goals, answering patient's questions and coordinating care.  Cc:  Rusty Aus, MD

## 2022-11-07 ENCOUNTER — Encounter: Payer: Self-pay | Admitting: Neurology

## 2022-11-07 ENCOUNTER — Ambulatory Visit: Payer: HMO | Admitting: Neurology

## 2022-11-07 VITALS — BP 150/73 | HR 76 | Wt 149.6 lb

## 2022-11-07 DIAGNOSIS — R413 Other amnesia: Secondary | ICD-10-CM

## 2022-11-07 DIAGNOSIS — G20A1 Parkinson's disease without dyskinesia, without mention of fluctuations: Secondary | ICD-10-CM

## 2022-11-07 MED ORDER — ESCITALOPRAM OXALATE 10 MG PO TABS: 10.0000 mg | ORAL_TABLET | Freq: Every day | ORAL | 1 refills | Status: DC

## 2022-11-07 MED ORDER — CARBIDOPA-LEVODOPA 25-100 MG PO TABS: 2.0000 | ORAL_TABLET | Freq: Three times a day (TID) | ORAL | 1 refills | Status: DC

## 2022-11-07 NOTE — Patient Instructions (Addendum)
Change carbidopa/levodopa 25/100 to 8am/noon/4pm Start lexapro, 10 mg daily    As a reminder, carbidopa/levodopa can be taken at the same time as a carbohydrate, but we like to have you take your pill either 30 minutes before a protein source or 1 hour after as protein can interfere with carbidopa/levodopa absorption.  You have been referred for a neurocognitive evaluation (i.e., evaluation of memory and thinking abilities). Please bring someone with you to this appointment if possible, as it is helpful for the neuropsychologist to hear from both you and another adult who knows you well. Please bring eyeglasses and hearing aids if you wear them and take any medications as you normally would.    The evaluation will take approximately 2-3 hours and has two parts:   The first part is a clinical interview with the neuropsychologist, Dr. Melvyn Novas.  During the interview, the neuropsychologist will speak with you and the individual you brought to the appointment.    The second part of the evaluation is testing with the doctor's technician, aka psychometrician, Hinton Dyer or [employer]. During the testing, the technician will ask you to remember different types of material, solve problems, and answer some questionnaires. Your family member will not be present for this portion of the evaluation.   Please note: We have to reserve several hours of the neuropsychologist's time and the psychometrician's time for your evaluation appointment. As such, there is a No-Show fee of $100. If you are unable to attend any of your appointments, please contact our office as soon as possible to reschedule.

## 2022-11-11 IMAGING — MR MR HEAD W/O CM
13 series · 45 of 48 positions shown · non-contrast
Comparison: None.

CLINICAL DATA: Dizziness and lightheadedness when tilting head back

EXAM:
MRI HEAD WITHOUT CONTRAST
MRI CERVICAL SPINE WITHOUT CONTRAST
TECHNIQUE: Multiplanar, multiecho pulse sequences of the brain and surrounding
structures, and cervical spine, to include the craniocervical
junction and cervicothoracic junction, were obtained without
intravenous contrast.

[Series 5: ax dwi_tracew · axial · 3.0mm · 0.65mm/px · z∈[-99,+56]mm · 2 of 48 slices shown]
[im 1/48]
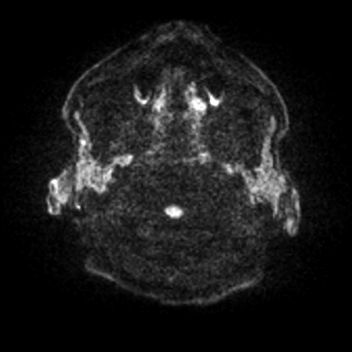
[im 48/48]
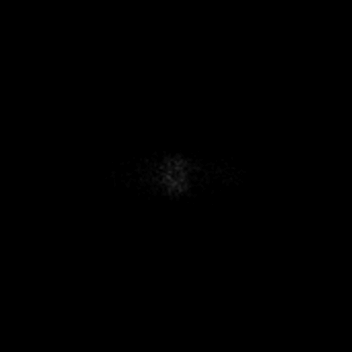

[Series 6: ax dwi_adc · axial · 3.0mm · 0.65mm/px · z∈[-99,+53]mm · 2 of 47 slices shown]
[im 1/47]
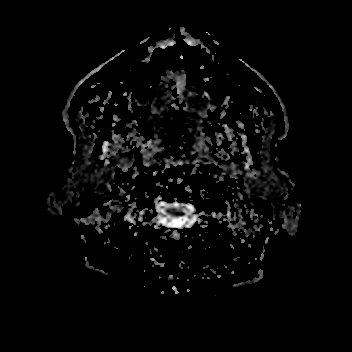
[im 47/47]
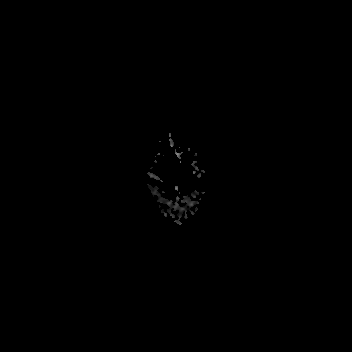

[Series 7: cor dwi_tracew · coronal · 5.0mm · 0.60mm/px · 3 of 38 slices shown]
[im 1/38]
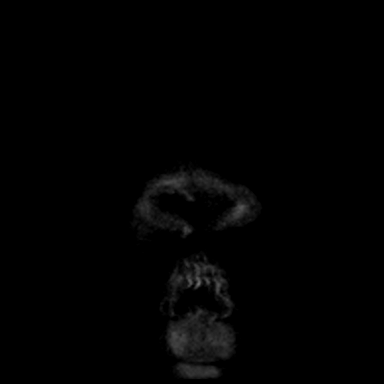
[im 19/38]
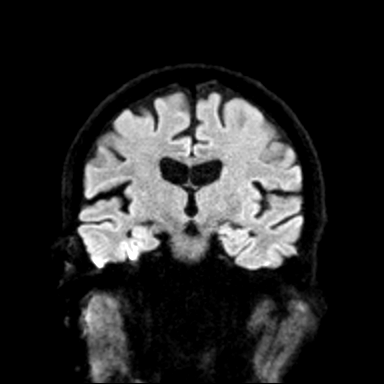
[im 38/38]
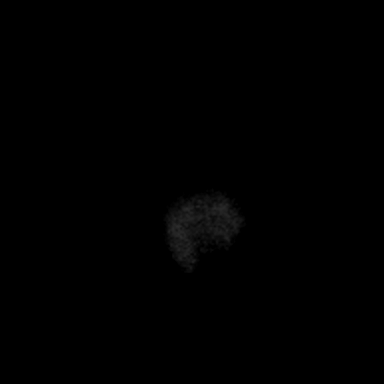

[Series 8: cor dwi_adc · coronal · 5.0mm · 0.60mm/px · 3 of 38 slices shown]
[im 1/38]
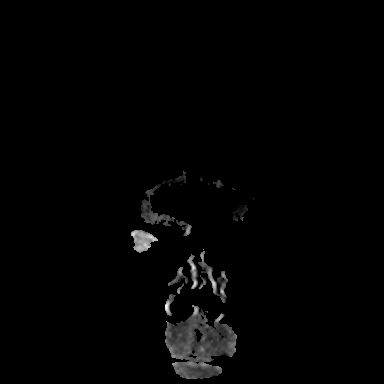
[im 19/38]
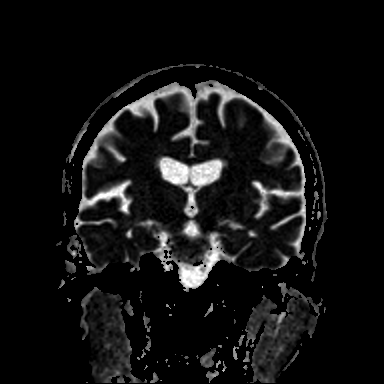
[im 38/38]
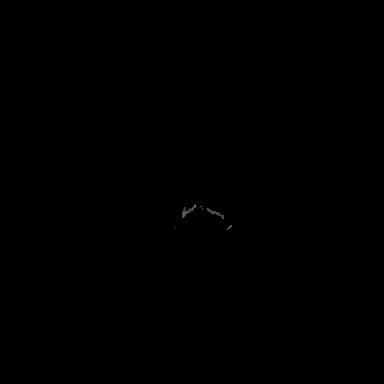

[Series 9: T1 · sagittal · 5.0mm · 0.62mm/px · 2 of 25 slices shown (1 of 2)]
[im 1/25]
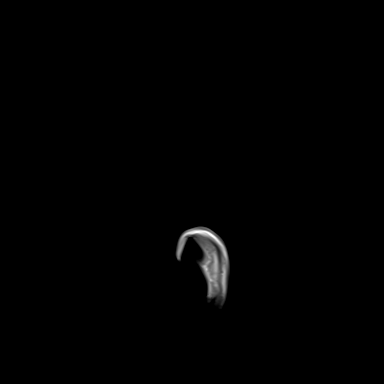
[im 25/25]
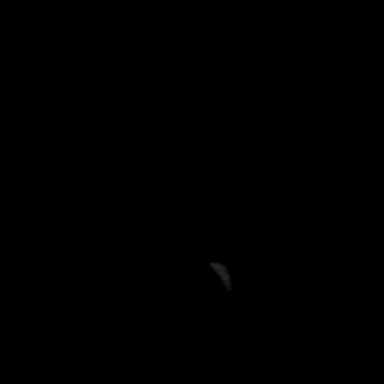

[Series 10: T2 · axial · 5.0mm · 0.53mm/px · z∈[-93,+51]mm · 2 of 25 slices shown (1 of 2)]
[im 1/25]
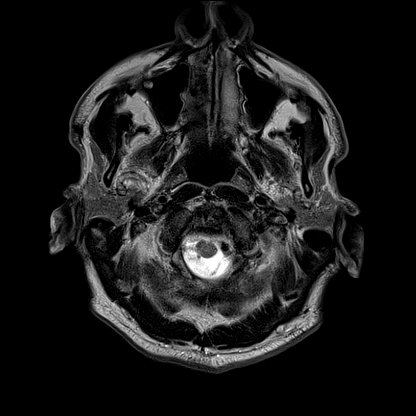
[im 25/25]
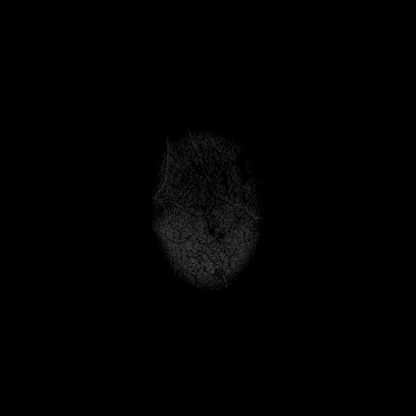

[Series 11: mag_images · axial · 3.0mm · 0.90mm/px · z∈[-109,+68]mm · 4 of 60 slices shown]
[im 1/60]
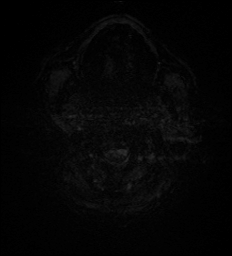
[im 20/60]
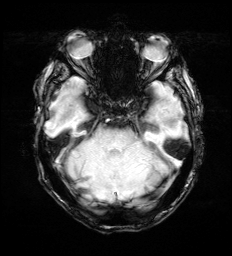
[im 40/60]
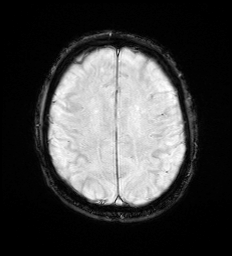
[im 60/60]
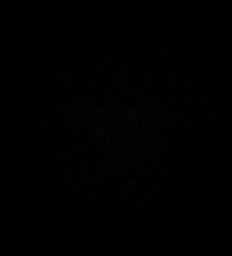

[Series 12: pha_images · axial · 3.0mm · 0.90mm/px · z∈[-109,+68]mm · 4 of 60 slices shown]
[im 1/60]
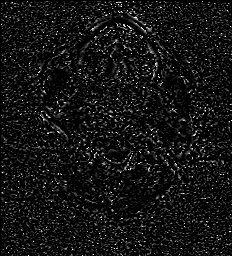
[im 20/60]
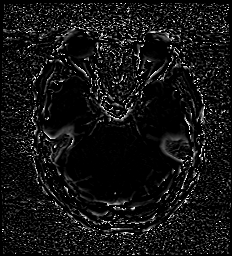
[im 40/60]
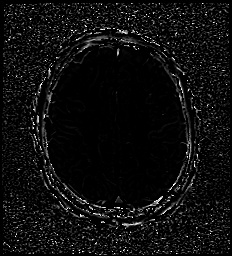
[im 60/60]
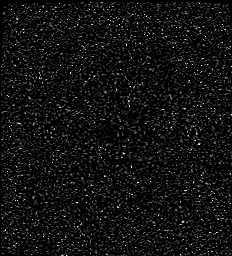

[Series 13: swi_images · axial · 3.0mm · 0.90mm/px · z∈[-109,+68]mm · 4 of 60 slices shown]
[im 1/60]
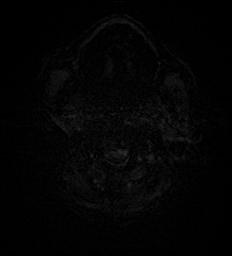
[im 20/60]
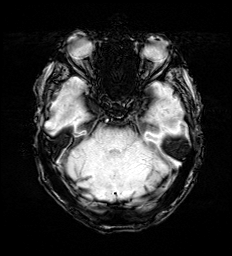
[im 40/60]
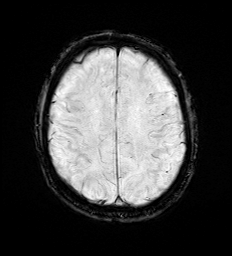
[im 60/60]
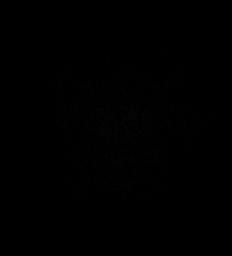

[Series 14: mip_images(sw) · axial · 24.0mm · 0.90mm/px · z∈[-98,+57]mm · 4 of 53 slices shown]
[im 1/53]
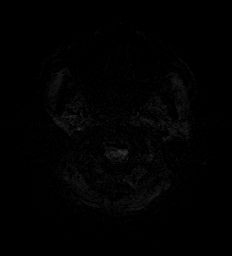
[im 18/53]
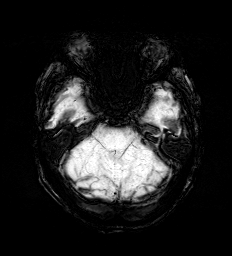
[im 35/53]
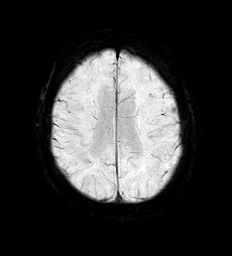
[im 53/53]
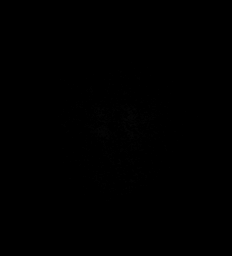

[Series 15: FLAIR · axial · 3.0mm · 0.53mm/px · z∈[-102,+60]mm · 4 of 55 slices shown]
[im 1/55]
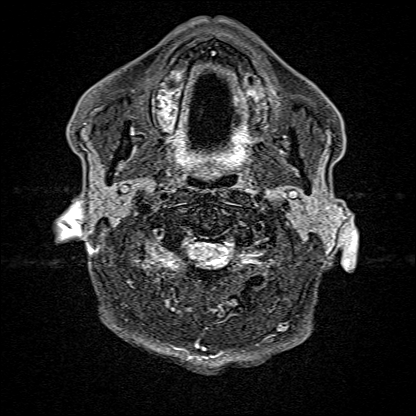
[im 19/55]
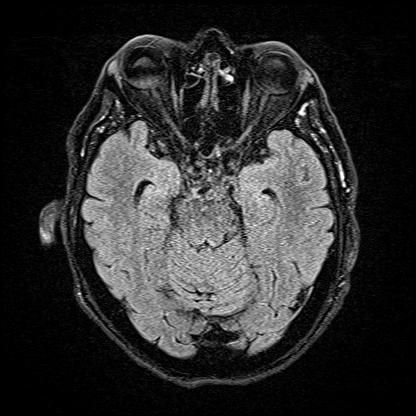
[im 37/55]
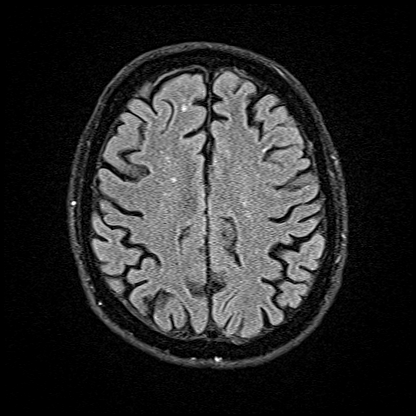
[im 55/55]
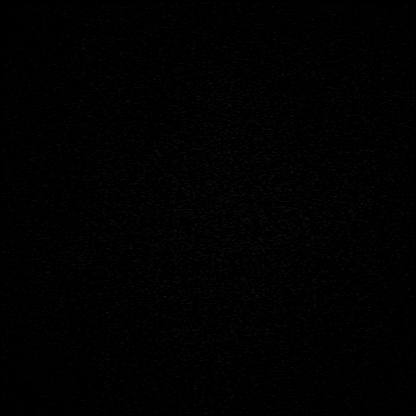

[Series 16: T1 · axial · 1.0mm · 0.98mm/px · z∈[-109,+66]mm · 9 of 176 slices shown (2 of 2)]
[im 1/176]
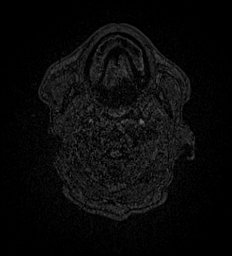
[im 16/176]
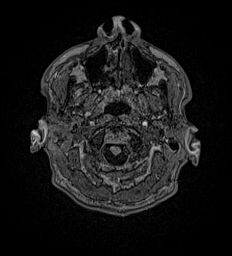
[im 32/176]
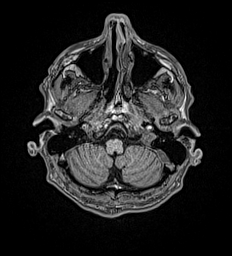
[im 48/176]
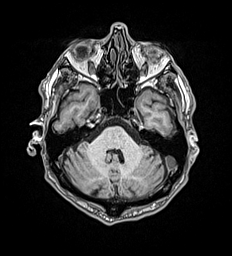
[im 80/176]
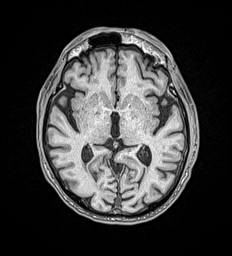
[im 96/176]
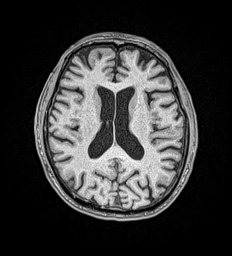
[im 128/176]
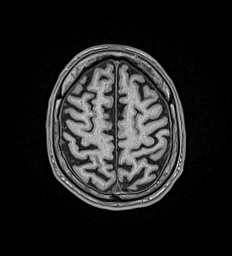
[im 144/176]
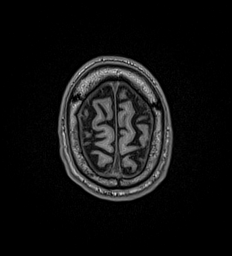
[im 176/176]
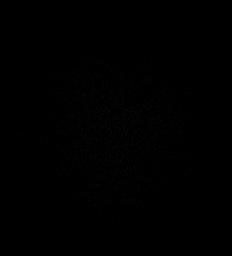

[Series 17: T2 · coronal · 5.0mm · 0.57mm/px · 2 of 29 slices shown (2 of 2)]
[im 1/29]
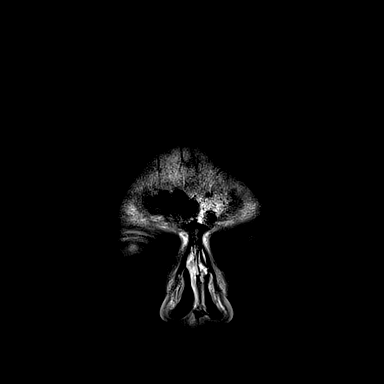
[im 29/29]
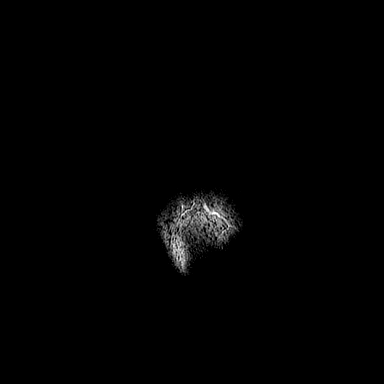

[45 of 48 positions shown; findings below may reference images not displayed]

FINDINGS: MRI HEAD FINDINGS

Brain: No acute infarction, hemorrhage, hydrocephalus, extra-axial
collection or mass lesion. Small FLAIR hyperintensities in the
cerebral white matter attributed to mild chronic small vessel
ischemia. Mild for age cerebral volume loss

Vascular: Normal flow voids

Skull and upper cervical spine: Normal marrow signal

Sinuses/Orbits: Bilateral cataract resection and scleral banding.

MRI CERVICAL SPINE FINDINGS

Alignment: Degenerative straightening of the cervical spine.

Vertebrae: No fracture, evidence of discitis, or bone lesion.

Cord: No cord edema.  Degenerative flattening described above.

Posterior Fossa, vertebral arteries, paraspinal tissues: Negative

Disc levels:

C1-2: Bilateral facet spurring and settling with spurs encroaching
on bilateral C2 foramina.

C2-3: Facet spurring with asymmetric bulky left involvement and
ankylosis.

C3-4: Disc collapse with endplate and uncovertebral ridging.
Biforaminal impingement. A right eccentric disc osteophyte complex
flattens the cord. Contributing ligamentum flavum thickening.

C4-5: Disc narrowing and bulging with uncovertebral spurring.
Moderate left more than right foraminal stenosis. A disc osteophyte
complex encroaches on the ventral cord

C5-6: Disc collapse with endplate and uncovertebral ridging
eccentric to the left foramen. Mild spinal stenosis

C6-7: Disc narrowing and left eccentric bulging with uncovertebral
spurring. Left foraminal impingement. A central disc protrusion
partially effaces the ventral subarachnoid space.

C7-T1:Facet spurring without neural impingement.
IMPRESSION: Brain MRI:

Unremarkable for age.

Cervical spine:

1. Cervical spine degeneration most notable at C3-4 where there is
right eccentric cord flattening and advanced biforaminal
impingement.
2. Additional foraminal narrowings on the left at C4-5 to C6-7 and
on the right at C4-5.
3. C2-3 facet ankylosis.

## 2022-11-11 IMAGING — MR MR CERVICAL SPINE W/O CM
5 series · 37 of 48 positions shown · non-contrast
Comparison: None.

CLINICAL DATA: Dizziness and lightheadedness when tilting head back

EXAM:
MRI HEAD WITHOUT CONTRAST
MRI CERVICAL SPINE WITHOUT CONTRAST
TECHNIQUE: Multiplanar, multiecho pulse sequences of the brain and surrounding
structures, and cervical spine, to include the craniocervical
junction and cervicothoracic junction, were obtained without
intravenous contrast.

[Series 5: T2 · sagittal · 3.0mm · 0.62mm/px · 7 of 15 slices shown (1 of 2)]
[im 1/15]
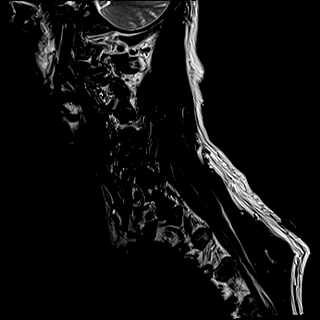
[im 3/15]
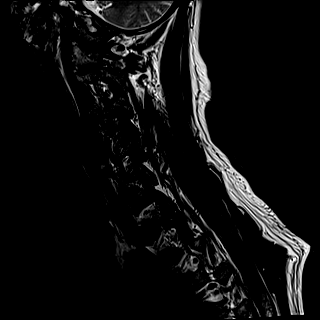
[im 5/15]
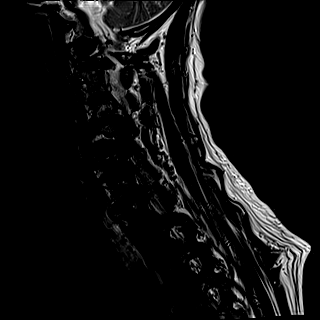
[im 8/15]
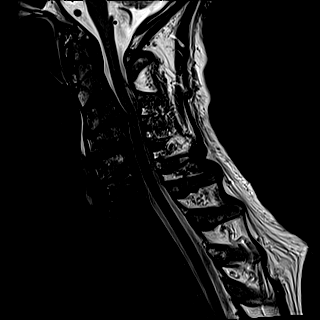
[im 10/15]
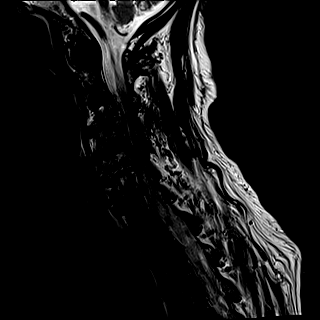
[im 12/15]
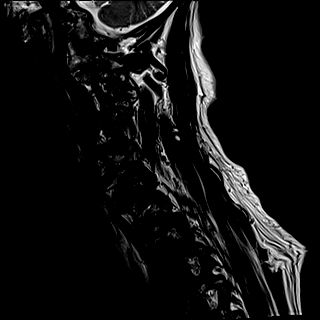
[im 15/15]
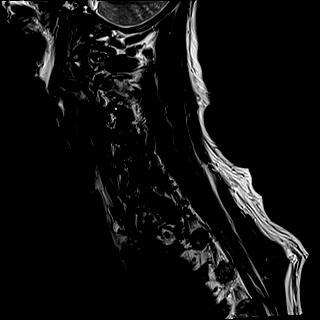

[Series 6: STIR · sagittal · 3.0mm · 0.62mm/px · 7 of 15 slices shown]
[im 1/15]
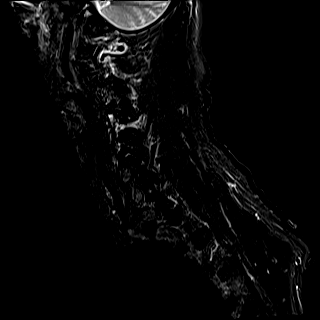
[im 3/15]
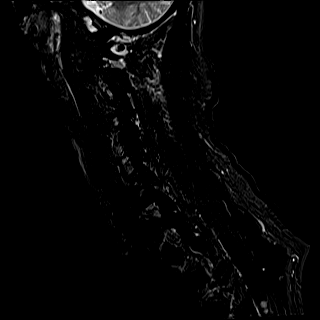
[im 5/15]
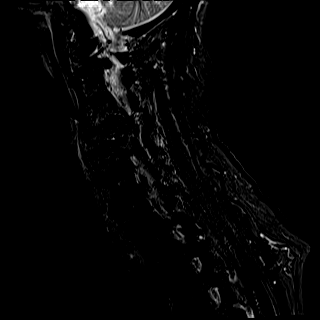
[im 8/15]
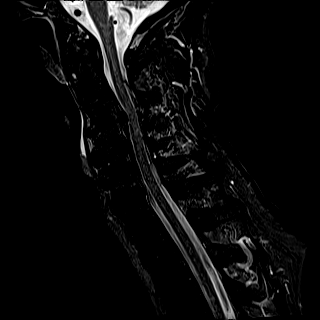
[im 10/15]
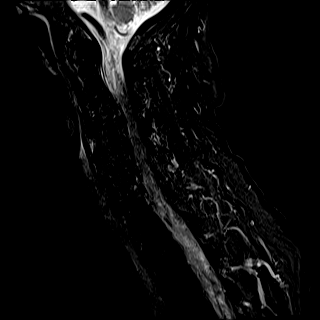
[im 12/15]
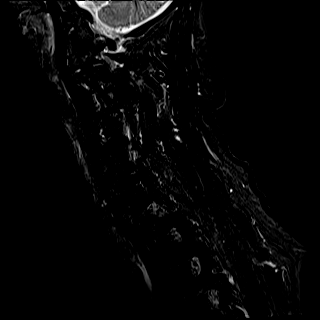
[im 15/15]
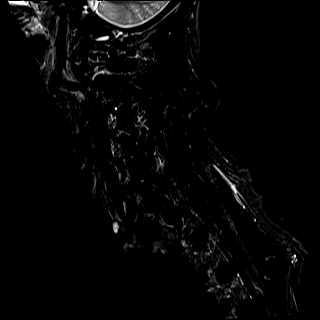

[Series 7: FLAIR · sagittal · 3.0mm · 0.78mm/px · 7 of 15 slices shown]
[im 1/15]
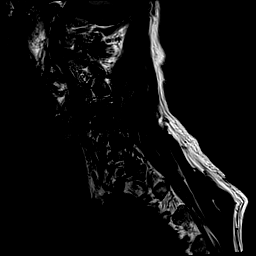
[im 3/15]
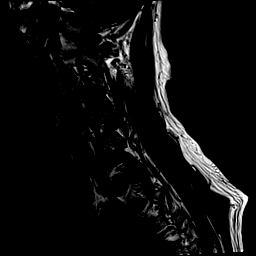
[im 5/15]
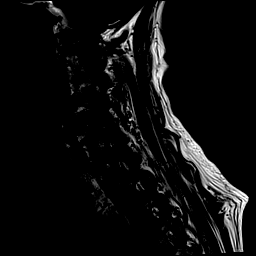
[im 8/15]
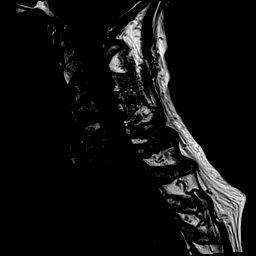
[im 10/15]
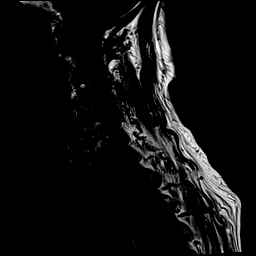
[im 12/15]
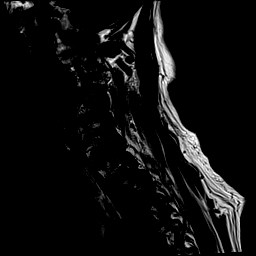
[im 15/15]
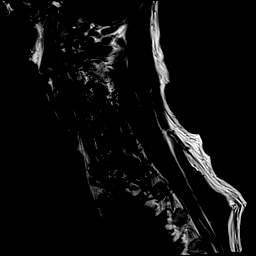

[Series 8: T2 · axial · 3.0mm · 0.70mm/px · z∈[-241,-153]mm · 9 of 27 slices shown (2 of 2)]
[im 1/27]
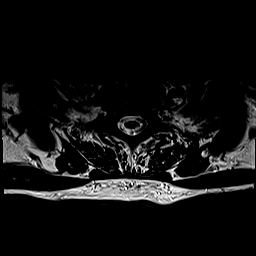
[im 5/27]
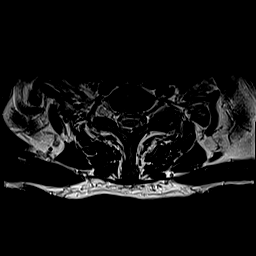
[im 9/27]
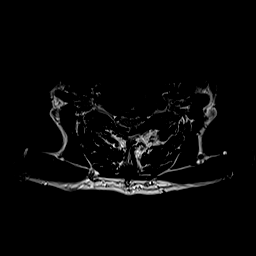
[im 11/27]
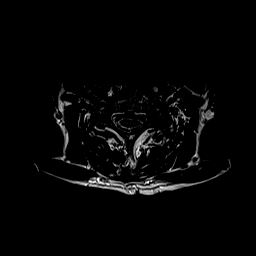
[im 14/27]
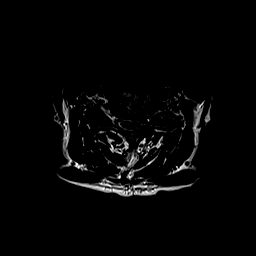
[im 16/27]
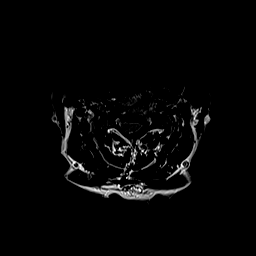
[im 18/27]
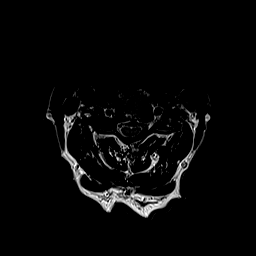
[im 22/27]
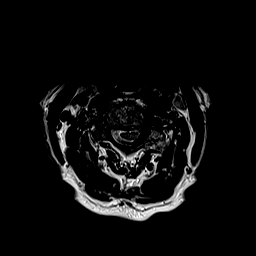
[im 27/27]
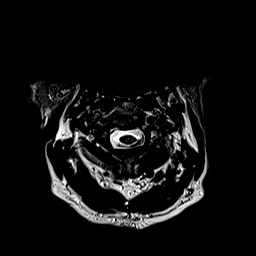

[Series 9: ax mpgr · axial · 3.0mm · 0.35mm/px · z∈[-241,-169]mm · 7 of 29 slices shown]
[im 1/29]
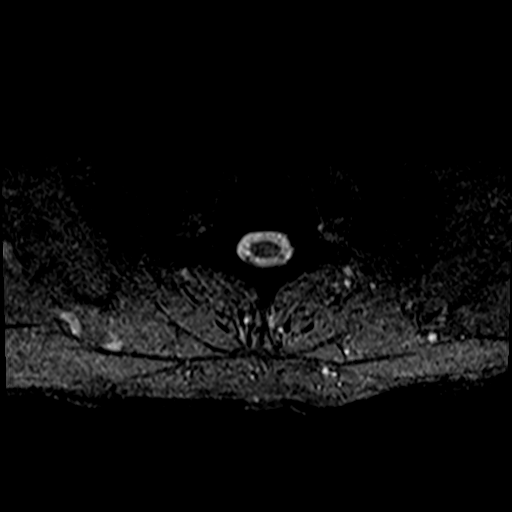
[im 5/29]
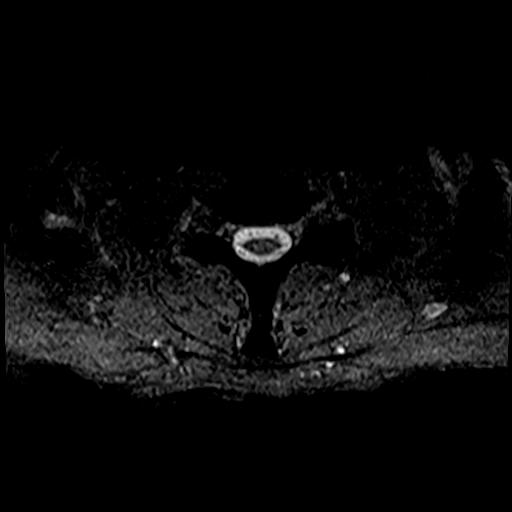
[im 9/29]
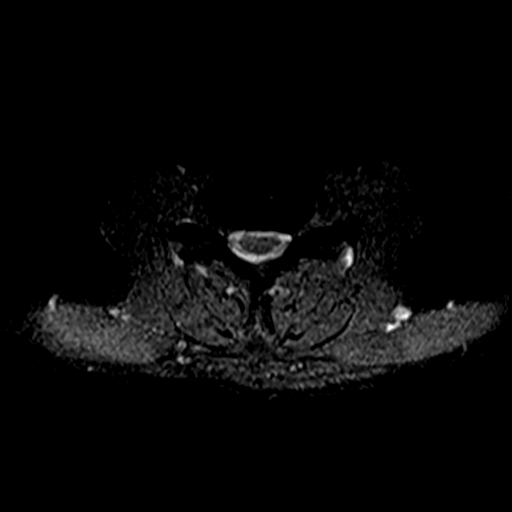
[im 13/29]
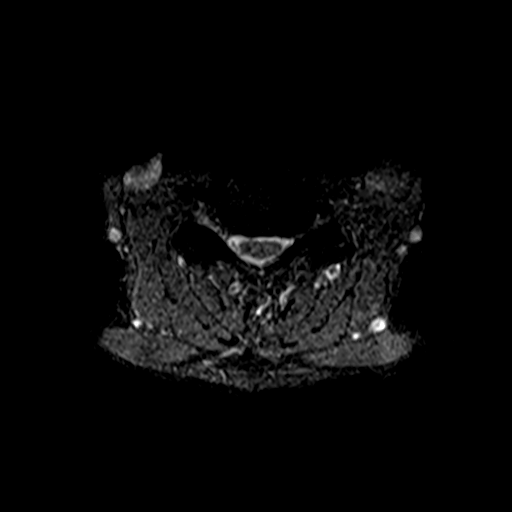
[im 16/29]
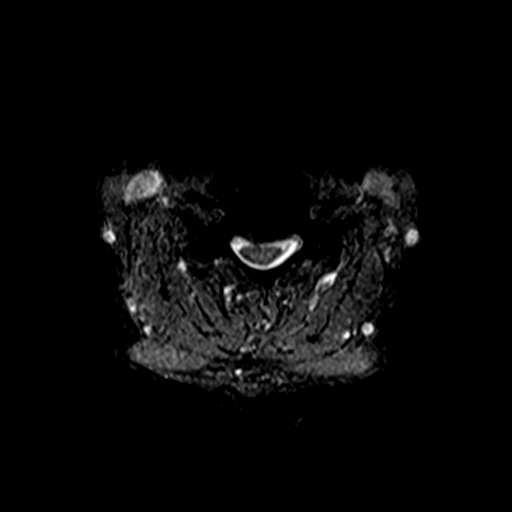
[im 20/29]
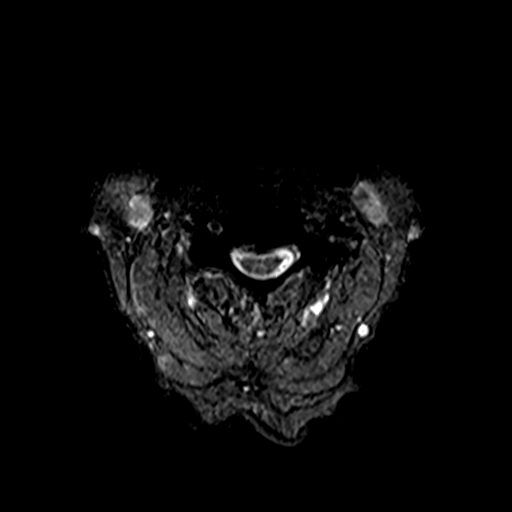
[im 24/29]
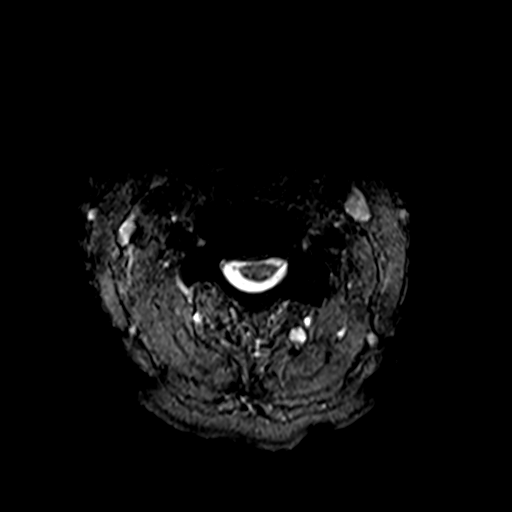

[37 of 48 positions shown; findings below may reference images not displayed]

FINDINGS: MRI HEAD FINDINGS

Brain: No acute infarction, hemorrhage, hydrocephalus, extra-axial
collection or mass lesion. Small FLAIR hyperintensities in the
cerebral white matter attributed to mild chronic small vessel
ischemia. Mild for age cerebral volume loss

Vascular: Normal flow voids

Skull and upper cervical spine: Normal marrow signal

Sinuses/Orbits: Bilateral cataract resection and scleral banding.

MRI CERVICAL SPINE FINDINGS

Alignment: Degenerative straightening of the cervical spine.

Vertebrae: No fracture, evidence of discitis, or bone lesion.

Cord: No cord edema.  Degenerative flattening described above.

Posterior Fossa, vertebral arteries, paraspinal tissues: Negative

Disc levels:

C1-2: Bilateral facet spurring and settling with spurs encroaching
on bilateral C2 foramina.

C2-3: Facet spurring with asymmetric bulky left involvement and
ankylosis.

C3-4: Disc collapse with endplate and uncovertebral ridging.
Biforaminal impingement. A right eccentric disc osteophyte complex
flattens the cord. Contributing ligamentum flavum thickening.

C4-5: Disc narrowing and bulging with uncovertebral spurring.
Moderate left more than right foraminal stenosis. A disc osteophyte
complex encroaches on the ventral cord

C5-6: Disc collapse with endplate and uncovertebral ridging
eccentric to the left foramen. Mild spinal stenosis

C6-7: Disc narrowing and left eccentric bulging with uncovertebral
spurring. Left foraminal impingement. A central disc protrusion
partially effaces the ventral subarachnoid space.

C7-T1:Facet spurring without neural impingement.
IMPRESSION: Brain MRI:

Unremarkable for age.

Cervical spine:

1. Cervical spine degeneration most notable at C3-4 where there is
right eccentric cord flattening and advanced biforaminal
impingement.
2. Additional foraminal narrowings on the left at C4-5 to C6-7 and
on the right at C4-5.
3. C2-3 facet ankylosis.

## 2022-11-26 DIAGNOSIS — E1159 Type 2 diabetes mellitus with other circulatory complications: Secondary | ICD-10-CM | POA: Diagnosis not present

## 2022-11-26 DIAGNOSIS — E1142 Type 2 diabetes mellitus with diabetic polyneuropathy: Secondary | ICD-10-CM | POA: Diagnosis not present

## 2022-12-04 ENCOUNTER — Encounter: Payer: HMO | Admitting: Psychology

## 2022-12-11 ENCOUNTER — Telehealth: Payer: Self-pay | Admitting: Neurology

## 2022-12-11 NOTE — Telephone Encounter (Signed)
Called patients wife to get more information  Patient is not sleeping waking at intervals This has gotten gradually worse since the visit with Dr. Carles Collet  Worse in the last week Hands shaking very bad  They do not see any improvement with taking the carbidopa levodopa 2 pills TID

## 2022-12-11 NOTE — Telephone Encounter (Signed)
Spoke to scheduler at [facility] and she has let me know that they are very backed up with 325 referrals in the [place]. She is going to call patient by the end of the week. Do you want me to tell patient any other recommendations at this time ?

## 2022-12-11 NOTE — Telephone Encounter (Signed)
[place]

## 2022-12-11 NOTE — Telephone Encounter (Signed)
Pt's wife called in stating she wanted to let Dr. Carles Collet know the pt has been more unstable and his body shakes recently. He has been unstable with everything he does.

## 2022-12-11 NOTE — Telephone Encounter (Signed)
Patient has gotten an exercise bike and riding it 30 min in the morning and 30 min in the evening. He is actively trying to find things to help with in the house and that they are doing things in the yard. He is taking his medication he is doing a program on tv that allows parkinson's exercises. Patients wife feels he is trying to do everything he can. He is not drinking alcohol. They are at a loss for what else to do

## 2022-12-12 ENCOUNTER — Other Ambulatory Visit: Payer: Self-pay

## 2022-12-12 DIAGNOSIS — G20A1 Parkinson's disease without dyskinesia, without mention of fluctuations: Secondary | ICD-10-CM

## 2022-12-12 NOTE — Telephone Encounter (Signed)
Talked to patient and they called this morning and have patient scheduled on March 23rd for [facility]. Patient drinking water and trying everything they can until then

## 2022-12-17 ENCOUNTER — Ambulatory Visit: Payer: HMO | Admitting: Neurology

## 2022-12-18 ENCOUNTER — Encounter: Payer: HMO | Admitting: Psychology

## 2022-12-24 DIAGNOSIS — H353211 Exudative age-related macular degeneration, right eye, with active choroidal neovascularization: Secondary | ICD-10-CM | POA: Diagnosis not present

## 2023-01-01 DIAGNOSIS — M4712 Other spondylosis with myelopathy, cervical region: Secondary | ICD-10-CM | POA: Diagnosis not present

## 2023-02-11 DIAGNOSIS — H353211 Exudative age-related macular degeneration, right eye, with active choroidal neovascularization: Secondary | ICD-10-CM | POA: Diagnosis not present

## 2023-02-13 ENCOUNTER — Ambulatory Visit: Payer: PPO | Attending: Neurology | Admitting: Occupational Therapy

## 2023-02-13 DIAGNOSIS — M6281 Muscle weakness (generalized): Secondary | ICD-10-CM

## 2023-02-13 DIAGNOSIS — G20A1 Parkinson's disease without dyskinesia, without mention of fluctuations: Secondary | ICD-10-CM | POA: Diagnosis not present

## 2023-02-13 NOTE — Therapy (Signed)
OUTPATIENT OCCUPATIONAL THERAPY NEURO EVALUATION  Patient Name: Darren Sharp MRN: [id number] DOB:July 23, 1946, 77 y.o., male Today's Date: 02/13/2023  PCP: Emily Filbert, MD REFERRING PROVIDER: Alonza Bogus, MD  END OF SESSION:  OT End of Session - 02/13/23 1522     Visit Number 1    Number of Visits 24    Date for OT Re-Evaluation 03/27/23    OT Start Time 78    OT Stop Time 1400    OT Time Calculation (min) 60 min    Activity Tolerance Patient tolerated treatment well    Behavior During Therapy [facility] for tasks assessed/performed             Past Medical History:  Diagnosis Date   COPD (chronic obstructive pulmonary disease) ([place])    COPD (chronic obstructive pulmonary disease) ([place])    Diabetes mellitus without complication ([place])    Hypertension    Past Surgical History:  Procedure Laterality Date   APPENDECTOMY     COLONOSCOPY WITH PROPOFOL N/A 06/05/2018   Procedure: COLONOSCOPY WITH PROPOFOL;  Surgeon: Lollie Sails, MD;  Location: [facility] ENDOSCOPY;  Service: Endoscopy;  Laterality: N/A;   ESOPHAGOGASTRODUODENOSCOPY (EGD) WITH PROPOFOL N/A 06/05/2018   Procedure: ESOPHAGOGASTRODUODENOSCOPY (EGD) WITH PROPOFOL;  Surgeon: Lollie Sails, MD;  Location: [facility] ENDOSCOPY;  Service: Endoscopy;  Laterality: N/A;   EYE SURGERY     HERNIA REPAIR     KNEE CAPSULOTOMY Right    Patient Active Problem List   Diagnosis Date Noted   Fever of unknown origin (FUO), generalized weakness 02/21/2022   Weakness    Parkinson's disease 02/19/2022   Major depressive disorder, recurrent, mild ([place]) 02/19/2022   Visual disturbance as complication of stroke [id number]   PAD (peripheral artery disease) ([place]) 01/11/2020   Tubular adenoma 12/30/2019   Aortic atherosclerosis ([place]) 09/14/2019   Benign essential hypertension 09/14/2019   DM type 2 with diabetic mixed hyperlipidemia ([place]) 09/14/2019   Medicare annual wellness visit, initial 09/14/2019   Panlobular emphysema ([place])  09/14/2019   Hypertension 01/28/2017   Hyperlipidemia 01/28/2017   Bronchitis 01/28/2017   History of COPD 01/28/2017   Hx of retinal detachment 01/28/2017   Hx of diabetes mellitus 01/28/2017    ONSET DATE: 12/2021  REFERRING DIAG: Parkinson's  THERAPY DIAG:  Muscle weakness (generalized)  Rationale for Evaluation and Treatment: Rehabilitation  SUBJECTIVE:   SUBJECTIVE STATEMENT: Pt. 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  Pt accompanied by: family member  PERTINENT HISTORY: Pt. Is a 77 y.o. male who was diagnosed with Parkinson's Disease in February of 2023. Pt. Was hospitalized with encephalopathy in 12/2021. Pt. PMHx includes: HTN, DM Type II, Macular Degeneration of the right eye, Stroke behind the left eye.   PRECAUTIONS: None  WEIGHT BEARING RESTRICTIONS: No  PAIN:  Are you having pain? No  FALLS: Has patient fallen in last 6 months? No, one episode of a near fall into a wall.   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  OBJECTIVE:   HAND DOMINANCE: Right  ADLs: Overall ADLs: Independent Equipment: Grab bars  IADLs: Light housekeeping: Assists with taking out the trash, dishes, vacuuming.  Meal Prep: Perfromed light meal prep including soup, sandwiches Community mobility: Drives, does not drive if  the weather is bad Medication management: Pt.'s wife assists with Pillbox set-up. Pt. Is responsible for taking meds, sets phone alarm to alert medication time Financial management: No change in the process; Reports shared duties 50% Handwriting: Not legible  MOBILITY STATUS: Independent with Freezing/Hesitation upon initiation  POSTURE COMMENTS:  rounded shoulders    FUNCTIONAL OUTCOME MEASURES: FOTO: 86  Berg Balance Scale: TBD  TUG: 9 sec.  5x's Sit to Stand: 13 sec.  6 min. Walk Test: 1270 ft.  Freezing of Gait: 7/20Pt. w  UPPER EXTREMITY ROM:    Active ROM Right WFL Left [facility]  Shoulder flexion    Shoulder abduction    Shoulder adduction    Shoulder extension    Shoulder internal rotation    Shoulder external rotation    Elbow flexion    Elbow extension    Wrist flexion    Wrist extension    Wrist ulnar deviation    Wrist radial deviation    Wrist pronation    Wrist supination    (Blank rows = not tested)  UPPER EXTREMITY MMT:     MMT Right eval Left eval  Shoulder flexion 5/5 5/5  Shoulder abduction 5/5 5/5  Shoulder adduction    Shoulder extension    Shoulder internal rotation    Shoulder external rotation    Middle trapezius    Lower trapezius    Elbow flexion 5/5 5/5  Elbow extension 5/5 5/5  Wrist flexion    Wrist extension 5/5 5/5  Wrist ulnar deviation    Wrist radial deviation    Wrist pronation    Wrist supination    (Blank rows = not tested)  HAND FUNCTION: Grip strength: Right: 54 lbs; Left: 55 lbs, Lateral pinch: Right: 25 lbs, Left: 27 lbs, and 3 point pinch: Right: 20 lbs, Left: 18 lbs  COORDINATION: 9 Hole Peg test: Right: 36 sec; Left: 32 sec  SENSATION: Not tested  EDEMA: N/A  COGNITION: Overall cognitive status: Within functional limits for tasks assessed  VISION: Subjective report:  Baseline vision: Wears glasses Visual history: macular degeneration: right eye, History of stroke behind left eye  VISION ASSESSMENT: To be further assessed in functional context  PRAXIS: WFL   TODAY'S TREATMENT:                                                                                                                              DATE: 02/13/2023   PATIENT EDUCATION: Education details: OT services POC, Goals Person educated: Patient and Spouse Education method: Explanation, Demonstration,  Tactile cues, and Verbal cues Education comprehension: verbalized understanding and returned demonstration  HOME EXERCISE PROGRAM:  Continue to provide ongoing HEP training as needed.   GOALS: Goals reviewed with patient? Yes  SHORT TERM GOALS: Target date: 03/06/2023      Pt. Will demonstrate independence with HEPs  Baseline: Eval: No current HEP Goal status: INITIAL   LONG TERM GOALS: Target date: 03/27/2023  1.  Pt. will increase FOTO score by 1 point to reflect Pt. perceived improvement with assessment specific ADL/IADL's.  Baseline: Eval: FOTO: 86 Goal status: INITIAL  2.  Pt. Will improve right hand [facility] skills by 2 sec. Of speed for improved control with manipulation of small objects. Baseline: Eval: R: 36 sec. L: 32 sec. Goal status: INITIAL  3.  Pt. Will improve 6 min. walk test score by 100 ft. With minimal cues for improved amplitude of movements. Baseline: Eval: 1270 ft. In 6 min. Goal status: INITIAL  4.  Pt. Will demonstrate improved dynamic standing balance during daily ADL/IADL tasks.  Baseline: Eval: Berg Balance Test: TBD Goal status: INITIAL  5.  Pt. Will improve writing legibility to be able to print his first name. Baseline: Eval: Not legible Goal status: INITIAL  ASSESSMENT:  CLINICAL IMPRESSION:  Patient is a 77 y.o male who was seen today for an occupational therapy evaluation for [facility] for Parkinson's Disease. Pt.  presents with limited balance, freezing of gait with hesitation at times during initiation, decreased right dominant hand FMC, tremors, and limited writing legibility. Pt. will benefit form OT services to work towards improving the amplitude of movements, improving balance, reducing risk of falls, and maximizing independence, and efficiency with ADLs, and IADL.  PERFORMANCE DEFICITS: in functional skills including ADLs, IADLs, dexterity, ROM, strength, Fine motor control, Gross motor control, balance, body mechanics,  and endurance, cognitive skills including attention and safety awareness, and psychosocial skills including environmental adaptation and routines and behaviors.   IMPAIRMENTS: are limiting patient from ADLs, IADLs, work, and leisure.   CO-MORBIDITIES: may have co-morbidities  that affects occupational performance. Patient will benefit from skilled OT to address above impairments and improve overall function.  MODIFICATION OR ASSISTANCE TO COMPLETE EVALUATION: Maximum or significant modification of tasks or assist is necessary to complete an evaluation.  OT OCCUPATIONAL PROFILE AND HISTORY: Comprehensive assessment: Review of records and extensive additional review of physical, cognitive, psychosocial history related to current functional performance.  CLINICAL DECISION MAKING: High - multiple treatment options, significant modification of task necessary  REHAB POTENTIAL: Good  EVALUATION COMPLEXITY: High    PLAN:  OT FREQUENCY:  17 visits  OT DURATION: 6 weeks  PLANNED INTERVENTIONS: self care/ADL training, therapeutic exercise, therapeutic activity, neuromuscular re-education, passive range of motion, balance training, and functional mobility training  RECOMMENDED OTHER SERVICES: N/A  CONSULTED AND AGREED WITH PLAN OF CARE: Patient and family member/caregiver  PLAN FOR NEXT SESSION: Berg Balance Test, and initiate treatment   Harrel Carina, MS, OTR/L  02/13/2023, 3:27 PM

## 2023-02-18 ENCOUNTER — Ambulatory Visit: Payer: PPO | Attending: Neurology | Admitting: Occupational Therapy

## 2023-02-18 DIAGNOSIS — M6281 Muscle weakness (generalized): Secondary | ICD-10-CM | POA: Diagnosis not present

## 2023-02-18 DIAGNOSIS — R278 Other lack of coordination: Secondary | ICD-10-CM | POA: Diagnosis not present

## 2023-02-18 NOTE — Therapy (Addendum)
OUTPATIENT OCCUPATIONAL THERAPY NEURO EVALUATION  Patient Name: Darren Sharp MRN: [id number] DOB:[birth year], 77 y.o., male Today's Date: 02/18/2023  PCP: Emily Filbert, MD REFERRING PROVIDER: Alonza Bogus, MD  END OF SESSION:   OT End of Session - 02/18/23 1557     Visit Number 2    Number of Visits 17    Date for OT Re-Evaluation 03/27/23    OT Start Time 1300    OT Stop Time 1400    OT Time Calculation (min) 60 min    Activity Tolerance Patient tolerated treatment well    Behavior During Therapy [facility] for tasks assessed/performed                       Past Medical History:  Diagnosis Date   COPD (chronic obstructive pulmonary disease) ([place])    COPD (chronic obstructive pulmonary disease) ([place])    Diabetes mellitus without complication ([place])    Hypertension    Past Surgical History:  Procedure Laterality Date   APPENDECTOMY     COLONOSCOPY WITH PROPOFOL N/A 06/05/2018   Procedure: COLONOSCOPY WITH PROPOFOL;  Surgeon: Lollie Sails, MD;  Location: [facility] ENDOSCOPY;  Service: Endoscopy;  Laterality: N/A;   ESOPHAGOGASTRODUODENOSCOPY (EGD) WITH PROPOFOL N/A 06/05/2018   Procedure: ESOPHAGOGASTRODUODENOSCOPY (EGD) WITH PROPOFOL;  Surgeon: Lollie Sails, MD;  Location: [facility] ENDOSCOPY;  Service: Endoscopy;  Laterality: N/A;   EYE SURGERY     HERNIA REPAIR     KNEE CAPSULOTOMY Right    Patient Active Problem List   Diagnosis Date Noted   Fever of unknown origin (FUO), generalized weakness 02/21/2022   Weakness    Parkinson's disease 02/19/2022   Major depressive disorder, recurrent, mild 02/19/2022   Visual disturbance as complication of stroke [id number]   PAD (peripheral artery disease) 01/11/2020   Tubular adenoma 12/30/2019   Aortic atherosclerosis 09/14/2019   Benign essential hypertension 09/14/2019   DM type 2 with diabetic mixed hyperlipidemia 09/14/2019   Medicare annual wellness visit, initial 09/14/2019   Panlobular emphysema 09/14/2019    Hypertension 01/28/2017   Hyperlipidemia 01/28/2017   Bronchitis 01/28/2017   History of COPD 01/28/2017   Hx of retinal detachment 01/28/2017   Hx of diabetes mellitus 01/28/2017    ONSET DATE: 12/2021  REFERRING DIAG: Parkinson's  THERAPY DIAG:  Muscle weakness (generalized)  Rationale for Evaluation and Treatment: Rehabilitation  SUBJECTIVE:   SUBJECTIVE STATEMENT:  Pt. Reports that he is planning  to go to [place] for a year, but return every 7 weeks for eye injections.   Pt accompanied by: family member  PERTINENT HISTORY: Pt. Is a 77 y.o. male who was diagnosed with Parkinson's Disease in February of 2023. Pt. Was hospitalized with encephalopathy in 12/2021. Pt. PMHx includes: HTN, DM Type II, Macular Degeneration of the right eye, Stroke behind the left eye.   PRECAUTIONS: None  WEIGHT BEARING RESTRICTIONS: No  PAIN:  Are you having pain? No  FALLS: Has patient fallen in last 6 months? No, one episode of a near fall into a wall.   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  OBJECTIVE:   HAND DOMINANCE: Right  ADLs: Overall ADLs: Independent Equipment: Grab bars  IADLs: Light housekeeping: Assists with taking out the trash, dishes, vacuuming.  Meal Prep: Perfromed light meal  prep including soup, sandwiches Community mobility: Drives, does not drive if the weather is bad Medication management: Pt.'s wife assists with Pillbox set-up. Pt. Is responsible for taking meds, sets phone alarm to alert medication time Financial management: No change in the process; Reports shared duties 50% Handwriting: Not legible  MOBILITY STATUS: Independent with Freezing/Hesitation upon initiation  POSTURE COMMENTS:  rounded shoulders   FUNCTIONAL  OUTCOME MEASURES: FOTO: 86  Berg Balance Scale: TBD  TUG: 9 sec.  5x's Sit to Stand: 13 sec.  6 min. Walk Test: 1270 ft.  Freezing of Gait: 7/20Pt.   Berg Balance Test: 50/56  UPPER EXTREMITY ROM:    Active ROM Right WFL Left [facility]  Shoulder flexion    Shoulder abduction    Shoulder adduction    Shoulder extension    Shoulder internal rotation    Shoulder external rotation    Elbow flexion    Elbow extension    Wrist flexion    Wrist extension    Wrist ulnar deviation    Wrist radial deviation    Wrist pronation    Wrist supination    (Blank rows = not tested)  UPPER EXTREMITY MMT:     MMT Right eval Left eval  Shoulder flexion 5/5 5/5  Shoulder abduction 5/5 5/5  Shoulder adduction    Shoulder extension    Shoulder internal rotation    Shoulder external rotation    Middle trapezius    Lower trapezius    Elbow flexion 5/5 5/5  Elbow extension 5/5 5/5  Wrist flexion    Wrist extension 5/5 5/5  Wrist ulnar deviation    Wrist radial deviation    Wrist pronation    Wrist supination    (Blank rows = not tested)  HAND FUNCTION: Grip strength: Right: 54 lbs; Left: 55 lbs, Lateral pinch: Right: 25 lbs, Left: 27 lbs, and 3 point pinch: Right: 20 lbs, Left: 18 lbs  COORDINATION: 9 Hole Peg test: Right: 36 sec; Left: 32 sec  SENSATION: Not tested  EDEMA: N/A  COGNITION: Overall cognitive status: Within functional limits for tasks assessed  VISION: Subjective report:  Baseline vision: Wears glasses Visual history: macular degeneration: right eye, History of stroke behind left eye  VISION ASSESSMENT: To be further assessed in functional context  PRAXIS: WFL   TODAY'S TREATMENT:                                                                                                                              DATE: 02/18/2023   LSVT: Patient seen for LSVT Daily Session Maximal Daily Exercises for facilitation/coordination of movement Maximum Sustained  Movements are designed to rescale the amplitude of movement output for generalization to daily functional activities. Performed as follows for 1 set of 10 repetitions each multi-directional sustained movements: 1) Floor to ceiling- cues to movement patterns, hand position, and posture 2) Side to side multidirectional- Cues for the back leg position   Repetitive  movements performed in standing and are designed to provide retraining effort needed for sustained muscle activation in tasks. Performed as follows for 1 set of 10 repetitions each of multi-directional repetitive movements: 3) Step and reach forward step- Pt. reports feeling more unsteady with stepping forward with the left leg. 4) Step and reach sideways step-  Pt. required cues for UE/hand position, and posture.  5) Step and reach backwards step-Pt. required extensive cues for for form, and each step of the movement. 6) Rock and reach forward/backward- Initially with task was graded down. Pt. required cues for increased length of stance.   7) Rock and reach sideways- Pt. Required cus for UE position, and weightshift  during trunk rotation, and turning.  Functional Component Task: Sit to stand reps with visual, and verbal cues.  BIG ambulation:  1057 ft. total: 825 ft. walking with increased amplitude of both the UE, and LEs, and 232 ft. With emphasis on arm swing only.   PATIENT EDUCATION: Education details: OT services POC, Goals Person educated: Patient and Spouse Education method: Explanation, Demonstration, Tactile cues, and Verbal cues Education comprehension: verbalized understanding and returned demonstration  HOME EXERCISE PROGRAM:  Continue to provide ongoing HEP training as needed.   GOALS: Goals reviewed with patient? Yes  SHORT TERM GOALS: Target date: 03/06/2023      Pt. Will demonstrate independence with HEPs  Baseline: Eval: No current HEP Goal status: INITIAL   LONG TERM GOALS: Target date:  03/27/2023    1.  Pt. will increase FOTO score by 1 point to reflect Pt. perceived improvement with assessment specific ADL/IADL's.  Baseline: Eval: FOTO: 86 Goal status: INITIAL  2.  Pt. Will improve right hand [facility] skills by 2 sec. Of speed for improved control with manipulation of small objects. Baseline: Eval: R: 36 sec. L: 32 sec. Goal status: INITIAL  3.  Pt. Will improve 6 min. walk test score by 100 ft. With minimal cues for improved amplitude of movements. Baseline: Eval: 1270 ft. In 6 min. Goal status: INITIAL  4.  Pt. Will demonstrate improved dynamic standing balance during daily ADL/IADL tasks.  Baseline: Eval: Berg Balance Test: TBD Goal status: INITIAL  5.  Pt. Will improve writing legibility to be able to print his first name. Baseline: Eval: Not legible Goal status: INITIAL  ASSESSMENT:  CLINICAL IMPRESSION:  The Berg Balance Test was administered to the Pt. with a score of 50/56. Pt. requires increased cues for posture, hand position, and amplitude for each of the maximal daily exercises. A chair was available at his side for support in case he needed it to steady himself during the maximal daily exercises, however Pt. rarely required it. Pt. presents with limited bilateral arm swing posteriorly during BIG ambulation.  Pt. requires mirroring of each of the Maximal Daily Exercises. Pt. Continues to benefit from OT services to work towards improving the amplitude of movements, improving balance, reducing risk of falls, and maximizing independence, and efficiency with ADLs, and IADL.  PERFORMANCE DEFICITS: in functional skills including ADLs, IADLs, dexterity, ROM, strength, Fine motor control, Gross motor control, balance, body mechanics, and endurance, cognitive skills including attention and safety awareness, and psychosocial skills including environmental adaptation and routines and behaviors.   IMPAIRMENTS: are limiting patient from ADLs, IADLs, work, and leisure.    CO-MORBIDITIES: may have co-morbidities  that affects occupational performance. Patient will benefit from skilled OT to address above impairments and improve overall function.  MODIFICATION OR ASSISTANCE TO COMPLETE EVALUATION: Maximum or significant  modification of tasks or assist is necessary to complete an evaluation.  OT OCCUPATIONAL PROFILE AND HISTORY: Comprehensive assessment: Review of records and extensive additional review of physical, cognitive, psychosocial history related to current functional performance.  CLINICAL DECISION MAKING: High - multiple treatment options, significant modification of task necessary  REHAB POTENTIAL: Good  EVALUATION COMPLEXITY: High    PLAN:  OT FREQUENCY:  17 visits  OT DURATION: 6 weeks  PLANNED INTERVENTIONS: self care/ADL training, therapeutic exercise, therapeutic activity, neuromuscular re-education, passive range of motion, balance training, and functional mobility training  RECOMMENDED OTHER SERVICES: N/A  CONSULTED AND AGREED WITH PLAN OF CARE: Patient and family member/caregiver  PLAN FOR NEXT SESSION: Berg Balance Test, and initiate treatment   Harrel Carina, MS, OTR/L  02/18/2023, 4:01 PM

## 2023-02-19 ENCOUNTER — Ambulatory Visit
Admission: RE | Admit: 2023-02-19 | Discharge: 2023-02-19 | Disposition: A | Discharge status: Home / Self Care | Payer: PPO | Source: Ambulatory Visit | Attending: Internal Medicine | Admitting: Internal Medicine

## 2023-02-19 ENCOUNTER — Ambulatory Visit: Payer: PPO | Admitting: Occupational Therapy

## 2023-02-19 DIAGNOSIS — Z87891 Personal history of nicotine dependence: Secondary | ICD-10-CM | POA: Diagnosis not present

## 2023-02-19 DIAGNOSIS — J432 Centrilobular emphysema: Secondary | ICD-10-CM | POA: Insufficient documentation

## 2023-02-19 DIAGNOSIS — I3481 Nonrheumatic mitral (valve) annulus calcification: Secondary | ICD-10-CM | POA: Diagnosis not present

## 2023-02-19 DIAGNOSIS — I2510 Atherosclerotic heart disease of native coronary artery without angina pectoris: Secondary | ICD-10-CM | POA: Insufficient documentation

## 2023-02-19 DIAGNOSIS — I700 Atherosclerosis of aorta: Secondary | ICD-10-CM | POA: Diagnosis not present

## 2023-02-19 DIAGNOSIS — Z122 Encounter for screening for malignant neoplasm of respiratory organs: Secondary | ICD-10-CM | POA: Diagnosis not present

## 2023-02-19 DIAGNOSIS — M6281 Muscle weakness (generalized): Secondary | ICD-10-CM

## 2023-02-19 NOTE — Therapy (Signed)
OUTPATIENT OCCUPATIONAL THERAPY NEURO TREATMENT  Patient Name: Darren Sharp MRN: [id number] DOB:31-May-1946, 77 y.o., male Today's Date: 02/19/2023  PCP: Emily Filbert, MD REFERRING PROVIDER: Alonza Bogus, MD  END OF SESSION:   OT End of Session - 02/19/23 1647     Visit Number 3    Number of Visits 17    Date for OT Re-Evaluation 03/27/23    OT Start Time 1300    OT Stop Time 1400    OT Time Calculation (min) 60 min    Activity Tolerance Patient tolerated treatment well    Behavior During Therapy [facility] for tasks assessed/performed                       Past Medical History:  Diagnosis Date   COPD (chronic obstructive pulmonary disease) ([place])    COPD (chronic obstructive pulmonary disease) ([place])    Diabetes mellitus without complication ([place])    Hypertension    Past Surgical History:  Procedure Laterality Date   APPENDECTOMY     COLONOSCOPY WITH PROPOFOL N/A 06/05/2018   Procedure: COLONOSCOPY WITH PROPOFOL;  Surgeon: Lollie Sails, MD;  Location: [facility] ENDOSCOPY;  Service: Endoscopy;  Laterality: N/A;   ESOPHAGOGASTRODUODENOSCOPY (EGD) WITH PROPOFOL N/A 06/05/2018   Procedure: ESOPHAGOGASTRODUODENOSCOPY (EGD) WITH PROPOFOL;  Surgeon: Lollie Sails, MD;  Location: [facility] ENDOSCOPY;  Service: Endoscopy;  Laterality: N/A;   EYE SURGERY     HERNIA REPAIR     KNEE CAPSULOTOMY Right    Patient Active Problem List   Diagnosis Date Noted   Fever of unknown origin (FUO), generalized weakness 02/21/2022   Weakness    Parkinson's disease 02/19/2022   Major depressive disorder, recurrent, mild 02/19/2022   Visual disturbance as complication of stroke [id number]   PAD (peripheral artery disease) 01/11/2020   Tubular adenoma 12/30/2019   Aortic atherosclerosis 09/14/2019   Benign essential hypertension 09/14/2019   DM type 2 with diabetic mixed hyperlipidemia 09/14/2019   Medicare annual wellness visit, initial 09/14/2019   Panlobular emphysema 09/14/2019    Hypertension 01/28/2017   Hyperlipidemia 01/28/2017   Bronchitis 01/28/2017   History of COPD 01/28/2017   Hx of retinal detachment 01/28/2017   Hx of diabetes mellitus 01/28/2017    ONSET DATE: 12/2021  REFERRING DIAG: Parkinson's  THERAPY DIAG:  Muscle weakness (generalized)  Rationale for Evaluation and Treatment: Rehabilitation  SUBJECTIVE:   SUBJECTIVE STATEMENT:  Pt. Reports having had dizziness when standing up after having been in supine during the MRI test this morning.  Pt accompanied by: family member  PERTINENT HISTORY: Pt. Is a 77 y.o. male who was diagnosed with Parkinson's Disease in February of 2023. Pt. Was hospitalized with encephalopathy in 12/2021. Pt. PMHx includes: HTN, DM Type II, Macular Degeneration of the right eye, Stroke behind the left eye.   PRECAUTIONS: None  WEIGHT BEARING RESTRICTIONS: No  PAIN:  Are you having pain? No  FALLS: Has patient fallen in last 6 months? No, one episode of a near fall into a wall.   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  OBJECTIVE:   HAND DOMINANCE: Right  ADLs: Overall ADLs: Independent Equipment: Grab bars  IADLs: Light housekeeping: Assists with taking out the trash, dishes, vacuuming.  Meal Prep: Perfromed light meal prep including soup, sandwiches  Community mobility: Drives, does not drive if the weather is bad Medication management: Pt.'s wife assists with Pillbox set-up. Pt. Is responsible for taking meds, sets phone alarm to alert medication time Financial management: No change in the process; Reports shared duties 50% Handwriting: Not legible  MOBILITY STATUS: Independent with Freezing/Hesitation upon initiation  POSTURE COMMENTS:  rounded shoulders   FUNCTIONAL  OUTCOME MEASURES: FOTO: 86  Berg Balance Scale: TBD  TUG: 9 sec.  5x's Sit to Stand: 13 sec.  6 min. Walk Test: 1270 ft.  Freezing of Gait: 7/20Pt.   Berg Balance Test: 50/56  UPPER EXTREMITY ROM:    Active ROM Right WFL Left [facility]  Shoulder flexion    Shoulder abduction    Shoulder adduction    Shoulder extension    Shoulder internal rotation    Shoulder external rotation    Elbow flexion    Elbow extension    Wrist flexion    Wrist extension    Wrist ulnar deviation    Wrist radial deviation    Wrist pronation    Wrist supination    (Blank rows = not tested)  UPPER EXTREMITY MMT:     MMT Right eval Left eval  Shoulder flexion 5/5 5/5  Shoulder abduction 5/5 5/5  Shoulder adduction    Shoulder extension    Shoulder internal rotation    Shoulder external rotation    Middle trapezius    Lower trapezius    Elbow flexion 5/5 5/5  Elbow extension 5/5 5/5  Wrist flexion    Wrist extension 5/5 5/5  Wrist ulnar deviation    Wrist radial deviation    Wrist pronation    Wrist supination    (Blank rows = not tested)  HAND FUNCTION: Grip strength: Right: 54 lbs; Left: 55 lbs, Lateral pinch: Right: 25 lbs, Left: 27 lbs, and 3 point pinch: Right: 20 lbs, Left: 18 lbs  COORDINATION: 9 Hole Peg test: Right: 36 sec; Left: 32 sec  SENSATION: Not tested  EDEMA: N/A  COGNITION: Overall cognitive status: Within functional limits for tasks assessed  VISION: Subjective report:  Baseline vision: Wears glasses Visual history: macular degeneration: right eye, History of stroke behind left eye  VISION ASSESSMENT: To be further assessed in functional context  PRAXIS: WFL   TODAY'S TREATMENT:                                                                                                                              DATE: 02/19/2023   LSVT: Patient seen for LSVT Daily Session Maximal Daily Exercises for facilitation/coordination of movement Maximum Sustained  Movements are designed to rescale the amplitude of movement output for generalization to daily functional activities. Performed as follows for 1 set of 10 repetitions each multi-directional sustained movements: 1) Floor to ceiling-  fewer cues were required for movement patterns, hand position, and posture 2) Side to side multidirectional- Cues were required for the back leg position  Repetitive movements performed in standing and are designed to provide retraining effort needed for sustained muscle activation in tasks. Performed as follows for 1 set of 10 repetitions each of multi-directional repetitive movements:  Adapted version performed 2/2 episode of dizziness this morning. 3) Step and reach forward step- Pt. reports feeling more unsteady with stepping forward with the left leg. 4) Step and reach sideways step-  Pt. required cues for UUE, and hand position, as well as head position/direction  5) Step and reach backwards step-Pt. Continues to  require extensive visual and verbal cues with mirroring for form, and each step of the movement. 6) Rock and reach forward/backward- Initially with task was graded down for rocking first prior to the addition of arms. Pt. required cues for increased length of stance.   7) Rock and reach sideways- Pt. Required cues for UE position, and weightshift  during trunk rotation, and turning.  Functional Component Task: Sit to stand: reps performed with visual, and verbal cues for amplitude of arm movement, and upright posture. Stepping over objects: Pt. worked on standing balance  with the right, followed by the left while stepping tapping cones in a semi-circle with the right foot. Pt. Worked on performing high knees prior to, and after tapping the cones. Pt. used the chair for support, however attempted to perform the task without the use of the chair.  Posterior Armswing: Pt. worked on increasing the amplitude of bilateral UE armswing with feedback  posteriorly using the wall.    BIG ambulation:    Pt. Performed 320 ft. Of Big ambulation with increased cues for form, and amplitude of movements, 160 ft. Of marching with high knees, and 160 ft. of walking with emphasis on increasing arm swing.   PATIENT EDUCATION: Education details: OT services POC, Goals Person educated: Patient and Spouse Education method: Explanation, Demonstration, Tactile cues, and Verbal cues Education comprehension: verbalized understanding and returned demonstration  HOME EXERCISE PROGRAM:  Continue to provide ongoing HEP training as needed.   GOALS: Goals reviewed with patient? Yes  SHORT TERM GOALS: Target date: 03/06/2023      Pt. Will demonstrate independence with HEPs  Baseline: Eval: No current HEP Goal status: INITIAL   LONG TERM GOALS: Target date: 03/27/2023    1.  Pt. will increase FOTO score by 1 point to reflect Pt. perceived improvement with assessment specific ADL/IADL's.  Baseline: Eval: FOTO: 86 Goal status: INITIAL  2.  Pt. Will improve right hand [facility] skills by 2 sec. Of speed for improved control with manipulation of small objects. Baseline: Eval: R: 36 sec. L: 32 sec. Goal status: INITIAL  3.  Pt. Will improve 6 min. walk test score by 100 ft. With minimal cues for improved amplitude of movements. Baseline: Eval: 1270 ft. In 6 min. Goal status: INITIAL  4.  Pt. Will demonstrate improved dynamic standing balance during daily ADL/IADL tasks.  Baseline: Eval: Berg Balance Test: TBD Goal status: INITIAL  5.  Pt. Will improve writing legibility to be able to print his first name. Baseline: Eval: Not legible Goal status: INITIAL  ASSESSMENT:  CLINICAL IMPRESSION:  Pt. performed the Maximal Daily exercises in the adapted version 2/2 an episode of dizziness earlier in the day after having had an MRI.  Pt. Continues to present with limited bilateral arm swing posteriorly during BIG ambulation.  Pt. Continues to require  mirroring of each of the Maximal Daily Exercises.  Pt.  Presented with LOB at times when challenged, however was able to  self-correct. Pt. Continues to benefit from OT services to work towards improving the amplitude of movements, improving balance, reducing risk of falls, and maximizing independence, and efficiency with ADLs, and IADL.  PERFORMANCE DEFICITS: in functional skills including ADLs, IADLs, dexterity, ROM, strength, Fine motor control, Gross motor control, balance, body mechanics, and endurance, cognitive skills including attention and safety awareness, and psychosocial skills including environmental adaptation and routines and behaviors.   IMPAIRMENTS: are limiting patient from ADLs, IADLs, work, and leisure.   CO-MORBIDITIES: may have co-morbidities  that affects occupational performance. Patient will benefit from skilled OT to address above impairments and improve overall function.  MODIFICATION OR ASSISTANCE TO COMPLETE EVALUATION: Maximum or significant modification of tasks or assist is necessary to complete an evaluation.  OT OCCUPATIONAL PROFILE AND HISTORY: Comprehensive assessment: Review of records and extensive additional review of physical, cognitive, psychosocial history related to current functional performance.  CLINICAL DECISION MAKING: High - multiple treatment options, significant modification of task necessary  REHAB POTENTIAL: Good  EVALUATION COMPLEXITY: High    PLAN:  OT FREQUENCY:  17 visits  OT DURATION: 6 weeks  PLANNED INTERVENTIONS: self care/ADL training, therapeutic exercise, therapeutic activity, neuromuscular re-education, passive range of motion, balance training, and functional mobility training  RECOMMENDED OTHER SERVICES: N/A  CONSULTED AND AGREED WITH PLAN OF CARE: Patient and family member/caregiver  PLAN FOR NEXT SESSION: Berg Balance Test, and initiate treatment   Harrel Carina, MS, OTR/L  02/19/2023, 4:49 PM

## 2023-02-20 ENCOUNTER — Other Ambulatory Visit: Payer: Self-pay

## 2023-02-20 ENCOUNTER — Ambulatory Visit: Payer: PPO | Admitting: Occupational Therapy

## 2023-02-20 ENCOUNTER — Telehealth: Payer: Self-pay | Admitting: Acute Care

## 2023-02-20 DIAGNOSIS — R911 Solitary pulmonary nodule: Secondary | ICD-10-CM

## 2023-02-20 DIAGNOSIS — M6281 Muscle weakness (generalized): Secondary | ICD-10-CM

## 2023-02-20 DIAGNOSIS — Z87891 Personal history of nicotine dependence: Secondary | ICD-10-CM

## 2023-02-20 DIAGNOSIS — R278 Other lack of coordination: Secondary | ICD-10-CM

## 2023-02-20 NOTE — Therapy (Addendum)
OUTPATIENT OCCUPATIONAL THERAPY NEURO TREATMENT  Patient Name: Darren Sharp MRN: [id number] DOB:1946/06/14, 77 y.o., male Today's Date: 02/20/2023  PCP: Emily Filbert, MD REFERRING PROVIDER: Alonza Bogus, MD  END OF SESSION:   OT End of Session - 02/20/23 1430     Visit Number 4    Number of Visits 17    Date for OT Re-Evaluation 03/27/23    OT Start Time 1300    OT Stop Time 1400    OT Time Calculation (min) 60 min    Activity Tolerance Patient tolerated treatment well    Behavior During Therapy WFL for tasks assessed/performed                       Past Medical History:  Diagnosis Date   COPD (chronic obstructive pulmonary disease) ([place])    COPD (chronic obstructive pulmonary disease) ([place])    Diabetes mellitus without complication ([place])    Hypertension    Past Surgical History:  Procedure Laterality Date   APPENDECTOMY     COLONOSCOPY WITH PROPOFOL N/A 06/05/2018   Procedure: COLONOSCOPY WITH PROPOFOL;  Surgeon: Lollie Sails, MD;  Location: [facility] ENDOSCOPY;  Service: Endoscopy;  Laterality: N/A;   ESOPHAGOGASTRODUODENOSCOPY (EGD) WITH PROPOFOL N/A 06/05/2018   Procedure: ESOPHAGOGASTRODUODENOSCOPY (EGD) WITH PROPOFOL;  Surgeon: Lollie Sails, MD;  Location: [facility] ENDOSCOPY;  Service: Endoscopy;  Laterality: N/A;   EYE SURGERY     HERNIA REPAIR     KNEE CAPSULOTOMY Right    Patient Active Problem List   Diagnosis Date Noted   Fever of unknown origin (FUO), generalized weakness 02/21/2022   Weakness    Parkinson's disease 02/19/2022   Major depressive disorder, recurrent, mild 02/19/2022   Visual disturbance as complication of stroke [id number]   PAD (peripheral artery disease) 01/11/2020   Tubular adenoma 12/30/2019   Aortic atherosclerosis 09/14/2019   Benign essential hypertension 09/14/2019   DM type 2 with diabetic mixed hyperlipidemia 09/14/2019   Medicare annual wellness visit, initial 09/14/2019   Panlobular emphysema 09/14/2019    Hypertension 01/28/2017   Hyperlipidemia 01/28/2017   Bronchitis 01/28/2017   History of COPD 01/28/2017   Hx of retinal detachment 01/28/2017   Hx of diabetes mellitus 01/28/2017    ONSET DATE: 12/2021  REFERRING DIAG: Parkinson's  THERAPY DIAG:  Muscle weakness (generalized)  Other lack of coordination  Rationale for Evaluation and Treatment: Rehabilitation  SUBJECTIVE:   SUBJECTIVE STATEMENT:  Pt. Reports having had dizziness when standing up after having been in supine during the MRI test this morning.  Pt accompanied by: family member  PERTINENT HISTORY: Pt. Is a 77 y.o. male who was diagnosed with Parkinson's Disease in February of 2023. Pt. Was hospitalized with encephalopathy in 12/2021. Pt. PMHx includes: HTN, DM Type II, Macular Degeneration of the right eye, Stroke behind the left eye.   PRECAUTIONS: None  WEIGHT BEARING RESTRICTIONS: No  PAIN:  Are you having pain? No  FALLS: Has patient fallen in last 6 months? No, one episode of a near fall into a wall.   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  OBJECTIVE:   HAND DOMINANCE: Right  ADLs: Overall ADLs: Independent Equipment: Grab bars  IADLs: Light housekeeping: Assists with taking out the trash, dishes, vacuuming.  Meal Prep: Perfromed light  meal prep including soup, sandwiches Community mobility: Drives, does not drive if the weather is bad Medication management: Pt.'s wife assists with Pillbox set-up. Pt. Is responsible for taking meds, sets phone alarm to alert medication time Financial management: No change in the process; Reports shared duties 50% Handwriting: Not legible  MOBILITY STATUS: Independent with Freezing/Hesitation upon initiation  POSTURE COMMENTS:   rounded shoulders   FUNCTIONAL OUTCOME MEASURES: FOTO: 86  Berg Balance Scale: TBD  TUG: 9 sec.  5x's Sit to Stand: 13 sec.  6 min. Walk Test: 1270 ft.  Freezing of Gait: 7/20Pt.   Berg Balance Test: 50/56  UPPER EXTREMITY ROM:    Active ROM Right WFL Left [facility]  Shoulder flexion    Shoulder abduction    Shoulder adduction    Shoulder extension    Shoulder internal rotation    Shoulder external rotation    Elbow flexion    Elbow extension    Wrist flexion    Wrist extension    Wrist ulnar deviation    Wrist radial deviation    Wrist pronation    Wrist supination    (Blank rows = not tested)  UPPER EXTREMITY MMT:     MMT Right eval Left eval  Shoulder flexion 5/5 5/5  Shoulder abduction 5/5 5/5  Shoulder adduction    Shoulder extension    Shoulder internal rotation    Shoulder external rotation    Middle trapezius    Lower trapezius    Elbow flexion 5/5 5/5  Elbow extension 5/5 5/5  Wrist flexion    Wrist extension 5/5 5/5  Wrist ulnar deviation    Wrist radial deviation    Wrist pronation    Wrist supination    (Blank rows = not tested)  HAND FUNCTION: Grip strength: Right: 54 lbs; Left: 55 lbs, Lateral pinch: Right: 25 lbs, Left: 27 lbs, and 3 point pinch: Right: 20 lbs, Left: 18 lbs  COORDINATION: 9 Hole Peg test: Right: 36 sec; Left: 32 sec  SENSATION: Not tested  EDEMA: N/A  COGNITION: Overall cognitive status: Within functional limits for tasks assessed  VISION: Subjective report:  Baseline vision: Wears glasses Visual history: macular degeneration: right eye, History of stroke behind left eye  VISION ASSESSMENT: To be further assessed in functional context  PRAXIS: WFL   TODAY'S TREATMENT:                                                                                                                              DATE: 02/20/2023   LSVT: Patient seen for LSVT Daily Session Maximal Daily Exercises for facilitation/coordination of  movement Maximum Sustained Movements are designed to rescale the amplitude of movement output for generalization to daily functional activities. Performed as follows for 1 set of 10 repetitions each multi-directional sustained movements: 1) Floor to ceiling- Pt. Continues to require fewer cues were required for movement patterns, hand position, and posture. Hand flicks  were added with  emphasis placed on digit extension, and thumb abduction. 2) Side to side multidirectional- Fewer cues were required for the back leg position. Hand flicks  were added with emphasis placed on digit extension, and thumb abduction.  Repetitive movements performed in standing and are designed to provide retraining effort needed for sustained muscle activation in tasks. Performed as follows for 1 set of 10 repetitions each of multi-directional repetitive movements:  The movements were performed in the standard position, with a chair available.  3) Step and reach forward step-  Slight LOB with stepping forward with the left leg. 4) Step and reach sideways step-  Pt. required cues for BUE, and hand position to elevate the back arm and increase the amplitude of the supination with palm facing up. 5) Step and reach backwards step-Pt. Continues to  requires extensive visual and verbal cues initially with mirroring for form, and each step of the movement. Pt. Was able to perform after multiple reps.  6) Rock and reach forward/backward- Pt. required cues for increased length of stance, and bilateral arm swing.   7) Rock and reach sideways- Pt. cues for UE position, and weightshift  during trunk rotation, elongation, and turning.  Functional Component Task:  Sit to stand: reps performed with visual, and verbal cues for amplitude of arm movement, upright posture, and armswing.  Stepping over objects: Pt. worked on  balancing while stepping over cones while challenges in various contexts including: straight line with high knees,  controlled tapping, and side stepping over the cones while balancing on the opposite LE.   Posterior Arm swing: Pt. worked on increasing the amplitude of bilateral UE armswing with feedback posteriorly using the wall. Pt. worked on ambulating while using two 1.5# dowels for feedback while alternating bilateral arm swing for 277ft.  Balance:  Pt. worked on standing balance skills on the [employer]. Pt. worked on facing, and holding onto a bar while reaching up with 1 hand to perform reaching tasks. Pt. Alternated hands during the task. Pt. Attempted to work on reps of balance without holding on to the bar.  BIG ambulation:    Pt. performed 1155 ft. Of BIG ambulation with increased cues for form, and amplitude of movements. Pt. worked on ambulating with high knees for 27ft.   PATIENT EDUCATION: Education details: OT services POC, Goals Person educated: Patient and Spouse Education method: Explanation, Demonstration, Tactile cues, and Verbal cues Education comprehension: verbalized understanding and returned demonstration  HOME EXERCISE PROGRAM:  Continue to provide ongoing HEP training as needed.   GOALS: Goals reviewed with patient? Yes  SHORT TERM GOALS: Target date: 03/06/2023      Pt. Will demonstrate independence with HEPs  Baseline: Eval: No current HEP Goal status: INITIAL   LONG TERM GOALS: Target date: 03/27/2023    1.  Pt. will increase FOTO score by 1 point to reflect Pt. perceived improvement with assessment specific ADL/IADL's.  Baseline: Eval: FOTO: 86 Goal status: INITIAL  2.  Pt. Will improve right hand [facility] skills by 2 sec. Of speed for improved control with manipulation of small objects. Baseline: Eval: R: 36 sec. L: 32 sec. Goal status: INITIAL  3.  Pt. Will improve 6 min. walk test score by 100 ft. With minimal cues for improved amplitude of movements. Baseline: Eval: 1270 ft. In 6 min. Goal status: INITIAL  4.  Pt. Will  demonstrate improved dynamic standing balance during daily ADL/IADL tasks.  Baseline: Eval: Berg Balance Test: TBD Goal status: INITIAL  5.  Pt. Will improve writing legibility to be able to print his first name. Baseline: Eval: Not legible Goal status: INITIAL  ASSESSMENT:  CLINICAL IMPRESSION:  Pt. reports no episodes of dizziness today. Pt. was able to perform the standard version of the Maximal Daily Exercises today. A chair was placed closed by, however, however Pt. Did not need to use it. Pt. continues to present with limited bilateral arm swing posteriorly during BIG ambulation. Pt. continues to require mirroring of each of the Maximal Daily Exercises with verbal cues for form, and to increase the amplitude of the movements. Pt. presented with LOB at times when challenged during balance activities, however was able to self-correct each time. Pt. continues to benefit from OT services to work towards improving the amplitude of movements, improving balance, reducing risk of falls, and maximizing independence, and efficiency with ADLs, and IADL.  PERFORMANCE DEFICITS: in functional skills including ADLs, IADLs, dexterity, ROM, strength, Fine motor control, Gross motor control, balance, body mechanics, and endurance, cognitive skills including attention and safety awareness, and psychosocial skills including environmental adaptation and routines and behaviors.   IMPAIRMENTS: are limiting patient from ADLs, IADLs, work, and leisure.   CO-MORBIDITIES: may have co-morbidities  that affects occupational performance. Patient will benefit from skilled OT to address above impairments and improve overall function.  MODIFICATION OR ASSISTANCE TO COMPLETE EVALUATION: Maximum or significant modification of tasks or assist is necessary to complete an evaluation.  OT OCCUPATIONAL PROFILE AND HISTORY: Comprehensive assessment: Review of records and extensive additional review of physical, cognitive,  psychosocial history related to current functional performance.  CLINICAL DECISION MAKING: High - multiple treatment options, significant modification of task necessary  REHAB POTENTIAL: Good  EVALUATION COMPLEXITY: High    PLAN:  OT FREQUENCY:  17 visits  OT DURATION: 6 weeks  PLANNED INTERVENTIONS: self care/ADL training, therapeutic exercise, therapeutic activity, neuromuscular re-education, passive range of motion, balance training, and functional mobility training  RECOMMENDED OTHER SERVICES: N/A  CONSULTED AND AGREED WITH PLAN OF CARE: Patient and family member/caregiver  PLAN FOR NEXT SESSION: Berg Balance Test, and initiate treatment   Harrel Carina, MS, OTR/L  02/20/2023, 2:38 PM

## 2023-02-20 NOTE — Telephone Encounter (Signed)
Spoke with patient by phone, using two patient identifiers, to review results of LDCT.  Wife also listened in on call.  Emphysema and atherosclerosis, as previously noted.  New lung nodule with recommendation for follow up in 6 months, instead of 12 months, as a precaution. Likely benign.  Patient agrees to sooner follow up but states he spends much of his time in [place] and not sure if he will be back in October when CT is due.  He will call us when he returns.  Asked if patient has medical provider in [place] who he may could discuss this with and get the CT done while there.  He says he does not have medical provider in that area.  Wife states she will assist in getting the appt scheduled upon their return but it may be past due date.  Both acknowledged understanding of recommendation for 6 months follow up LDCT. Also noted were calcifications of the aortic valve and mitral valves.  Will fax results and note regarding calcifications if further recommendations for this patient.  Patient acknowledged understanding and had no questions.  Order placed for 6 months follow up nodule LDCT.

## 2023-02-21 ENCOUNTER — Ambulatory Visit: Payer: PPO | Admitting: Occupational Therapy

## 2023-02-21 ENCOUNTER — Telehealth: Payer: Self-pay

## 2023-02-21 DIAGNOSIS — M6281 Muscle weakness (generalized): Secondary | ICD-10-CM

## 2023-02-21 NOTE — Telephone Encounter (Signed)
Called patient to relay message and he was at therapy

## 2023-02-21 NOTE — Telephone Encounter (Signed)
Called back and spoke to patients wife ( she was  nurse for over 24 years) Patiens wife did give me much more information than I received from the rehab facility. She said patient is no longer on Hydrochlorothiazide he was taken off of this by the PCP patient has had these episodes at the eye doctor ,PT, and any time he lays flat and sits up quickly. Patients wife said she has been looking at the B/P and making sure it hasn't dropped.Patients wife does not agree that it is blood pressure related. She also wanted me to mention that they are going to [place] for a couple of weeks this month and if anything needs to be done hopefully they can do it before then. Patients wife at a loss of what is going on

## 2023-02-21 NOTE — Therapy (Signed)
OUTPATIENT OCCUPATIONAL THERAPY NEURO TREATMENT  Patient Name: Darren Sharp MRN: [id number] DOB:08-11-1946, 77 y.o., male Today's Date: 02/21/2023  PCP: Emily Filbert, MD REFERRING PROVIDER: Alonza Bogus, MD  END OF SESSION:   OT End of Session - 02/21/23 1536     Visit Number 5    Number of Visits 17    Date for OT Re-Evaluation 03/27/23    OT Start Time 1300    OT Stop Time 1400    OT Time Calculation (min) 60 min    Activity Tolerance Patient tolerated treatment well    Behavior During Therapy [facility] for tasks assessed/performed                       Past Medical History:  Diagnosis Date   COPD (chronic obstructive pulmonary disease) ([place])    COPD (chronic obstructive pulmonary disease) ([place])    Diabetes mellitus without complication ([place])    Hypertension    Past Surgical History:  Procedure Laterality Date   APPENDECTOMY     COLONOSCOPY WITH PROPOFOL N/A 06/05/2018   Procedure: COLONOSCOPY WITH PROPOFOL;  Surgeon: Lollie Sails, MD;  Location: [facility] ENDOSCOPY;  Service: Endoscopy;  Laterality: N/A;   ESOPHAGOGASTRODUODENOSCOPY (EGD) WITH PROPOFOL N/A 06/05/2018   Procedure: ESOPHAGOGASTRODUODENOSCOPY (EGD) WITH PROPOFOL;  Surgeon: Lollie Sails, MD;  Location: [facility] ENDOSCOPY;  Service: Endoscopy;  Laterality: N/A;   EYE SURGERY     HERNIA REPAIR     KNEE CAPSULOTOMY Right    Patient Active Problem List   Diagnosis Date Noted   Fever of unknown origin (FUO), generalized weakness 02/21/2022   Weakness    Parkinson's disease 02/19/2022   Major depressive disorder, recurrent, mild 02/19/2022   Visual disturbance as complication of stroke [id number]   PAD (peripheral artery disease) 01/11/2020   Tubular adenoma 12/30/2019   Aortic atherosclerosis 09/14/2019   Benign essential hypertension 09/14/2019   DM type 2 with diabetic mixed hyperlipidemia 09/14/2019   Medicare annual wellness visit, initial 09/14/2019   Panlobular emphysema 09/14/2019    Hypertension 01/28/2017   Hyperlipidemia 01/28/2017   Bronchitis 01/28/2017   History of COPD 01/28/2017   Hx of retinal detachment 01/28/2017   Hx of diabetes mellitus 01/28/2017    ONSET DATE: 12/2021  REFERRING DIAG: Parkinson's  THERAPY DIAG:  Muscle weakness (generalized)  Rationale for Evaluation and Treatment: Rehabilitation  SUBJECTIVE:   SUBJECTIVE STATEMENT:  Pt. With no reports of dizziness today.  Pt accompanied by: family member  PERTINENT HISTORY: Pt. Is a 77 y.o. male who was diagnosed with Parkinson's Disease in February of 2023. Pt. Was hospitalized with encephalopathy in 12/2021. Pt. PMHx includes: HTN, DM Type II, Macular Degeneration of the right eye, Stroke behind the left eye.   PRECAUTIONS: None  WEIGHT BEARING RESTRICTIONS: No  PAIN:  Are you having pain? No  FALLS: Has patient fallen in last 6 months? No, one episode of a near fall into a wall.   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  OBJECTIVE:   HAND DOMINANCE: Right  ADLs: Overall ADLs: Independent Equipment: Grab bars  IADLs: Light housekeeping: Assists with taking out the trash, dishes, vacuuming.  Meal Prep: Perfromed light meal prep including soup, sandwiches Community mobility: Drives, does not drive if the weather is bad Medication  management: Pt.'s wife assists with Pillbox set-up. Pt. Is responsible for taking meds, sets phone alarm to alert medication time Financial management: No change in the process; Reports shared duties 50% Handwriting: Not legible  MOBILITY STATUS: Independent with Freezing/Hesitation upon initiation  POSTURE COMMENTS:  rounded shoulders   FUNCTIONAL OUTCOME MEASURES: FOTO: 86  Berg Balance Scale: TBD  TUG: 9 sec.  5x's  Sit to Stand: 13 sec.  6 min. Walk Test: 1270 ft.  Freezing of Gait: 7/20Pt.   Berg Balance Test: 50/56  UPPER EXTREMITY ROM:    Active ROM Right WFL Left [facility]  Shoulder flexion    Shoulder abduction    Shoulder adduction    Shoulder extension    Shoulder internal rotation    Shoulder external rotation    Elbow flexion    Elbow extension    Wrist flexion    Wrist extension    Wrist ulnar deviation    Wrist radial deviation    Wrist pronation    Wrist supination    (Blank rows = not tested)  UPPER EXTREMITY MMT:     MMT Right eval Left eval  Shoulder flexion 5/5 5/5  Shoulder abduction 5/5 5/5  Shoulder adduction    Shoulder extension    Shoulder internal rotation    Shoulder external rotation    Middle trapezius    Lower trapezius    Elbow flexion 5/5 5/5  Elbow extension 5/5 5/5  Wrist flexion    Wrist extension 5/5 5/5  Wrist ulnar deviation    Wrist radial deviation    Wrist pronation    Wrist supination    (Blank rows = not tested)  HAND FUNCTION: Grip strength: Right: 54 lbs; Left: 55 lbs, Lateral pinch: Right: 25 lbs, Left: 27 lbs, and 3 point pinch: Right: 20 lbs, Left: 18 lbs  COORDINATION: 9 Hole Peg test: Right: 36 sec; Left: 32 sec  SENSATION: Not tested  EDEMA: N/A  COGNITION: Overall cognitive status: Within functional limits for tasks assessed  VISION: Subjective report:  Baseline vision: Wears glasses Visual history: macular degeneration: right eye, History of stroke behind left eye  VISION ASSESSMENT: To be further assessed in functional context  PRAXIS: WFL   TODAY'S TREATMENT:                                                                                                                              DATE: 02/21/2023   LSVT: Patient seen for LSVT Daily Session Maximal Daily Exercises for facilitation/coordination of movement Maximum Sustained Movements are designed to rescale the amplitude of movement output for  generalization to daily functional activities. Performed as follows for 1 set of 10 repetitions each multi-directional sustained movements: 1) Floor to ceiling- Pt. Continues to require fewer cues were required for movement patterns, hand position, and posture. Pt. Performed the task with bilateral hand flicks with emphasis placed on digit extension, and thumb abduction. 2) Side to side multidirectional- The  tasks was modified to increase challenge with alternating sides with every repetition. Pt. Performed the task with bilateral hand flicks  with cues for digit extension, and thumb abduction.  Repetitive movements performed in standing and are designed to provide retraining effort needed for sustained muscle activation in tasks. Performed as follows for 1 set of 10 repetitions each of multi-directional repetitive movements:  The movements were performed in the standard position, with a chair available.  3) Step and reach forward step-  No LOB with stepping forward with the left leg. 4) Step and reach sideways step-  Pt. required cues for BUE, and hand position to elevate the back arm and increase the amplitude of the supination with palm facing up. 5) Step and reach backwards step- Pt. Continues to  requires extensive visual and verbal cues initially with mirroring for form, and each step of the movement.  6) Rock and reach forward/backward- Pt. required cues for increased length of stance, and bilateral arm swing.   7) Rock and reach sideways- Pt. cues for UE position, and weightshift  during trunk rotation, elongation, and turning.  Functional Component Task:  Sit to stand: reps performed with visual, and verbal cues for amplitude of arm movement, upright posture, and armswing.  Stepping over objects: Pt. worked on  balancing while stepping over cones while challenges in various contexts including: straight line with high knees, controlled tapping, and side stepping over the cones while  balancing on the opposite LE.   Posterior Arm swing: Pt. worked on increasing the amplitude of bilateral UE armswing with feedback posteriorly using the wall. Pt. worked on ambulating while using two 1.5# dowels for feedback while alternating bilateral arm swing for 262ft.  Balance: Pt. worked on standing balance skills  using a 2" foam mat while standing and writing on a vertical whiteboard to challenge dual tasking while balancing. Pt. worked on standing balance while stepping one foot in front of the other without deviating from a straight line. Pt. worked on reaching across midline to reach, and move Sabo rings through 2 horizontal rungs. Pt. Perfromed this reaching task while challenging standing balance on a 2" foam surface.  BIG ambulation:    Pt. performed 1155 ft. Of BIG ambulation with increased cues for form, and amplitude of movements.    PATIENT EDUCATION: Education details: OT services POC, Goals Person educated: Patient and Spouse Education method: Explanation, Demonstration, Tactile cues, and Verbal cues Education comprehension: verbalized understanding and returned demonstration  HOME EXERCISE PROGRAM:  Continue to provide ongoing HEP training as needed.   GOALS: Goals reviewed with patient? Yes  SHORT TERM GOALS: Target date: 03/06/2023      Pt. Will demonstrate independence with HEPs  Baseline: Eval: No current HEP Goal status: INITIAL   LONG TERM GOALS: Target date: 03/27/2023    1.  Pt. will increase FOTO score by 1 point to reflect Pt. perceived improvement with assessment specific ADL/IADL's.  Baseline: Eval: FOTO: 86 Goal status: INITIAL  2.  Pt. Will improve right hand [facility] skills by 2 sec. Of speed for improved control with manipulation of small objects. Baseline: Eval: R: 36 sec. L: 32 sec. Goal status: INITIAL  3.  Pt. Will improve 6 min. walk test score by 100 ft. With minimal cues for improved amplitude of movements. Baseline: Eval: 1270 ft.  In 6 min. Goal status: INITIAL  4.  Pt. Will demonstrate improved dynamic standing balance during daily ADL/IADL tasks.  Baseline: Eval: Berg Balance Test: TBD Goal status:  INITIAL  5.  Pt. Will improve writing legibility to be able to print his first name. Baseline: Eval: Not legible Goal status: INITIAL  ASSESSMENT:  CLINICAL IMPRESSION:  Pt. reports no episodes of dizziness today. Pt. was able to perform the standard version of the Maximal Daily Exercises today. A chair was placed closed by, however, Pt. Did not need to use it. Pt. continues to present with limited bilateral arm swing posteriorly during BIG ambulation, however required fewer verbal cues. Pt. continues to require mirroring of each of the Maximal Daily Exercises with verbal cues for form, and to increase the amplitude of the movements. Pt. Required fewer cues for most of the Maximal Daily Exercises, however required increased cues for the backwards step, and reach. Pt. continues to present with LOB at times when challenged during balance activities, however was able to self-correct each time. Pt. Continues backwards step, and reach. Pt. Continues to benefit from OT services to work towards improving the amplitude of movements, improving balance, reducing risk of falls, and maximizing independence, and efficiency with ADLs, and IADL.  PERFORMANCE DEFICITS: in functional skills including ADLs, IADLs, dexterity, ROM, strength, Fine motor control, Gross motor control, balance, body mechanics, and endurance, cognitive skills including attention and safety awareness, and psychosocial skills including environmental adaptation and routines and behaviors.   IMPAIRMENTS: are limiting patient from ADLs, IADLs, work, and leisure.   CO-MORBIDITIES: may have co-morbidities  that affects occupational performance. Patient will benefit from skilled OT to address above impairments and improve overall function.  MODIFICATION OR ASSISTANCE TO  COMPLETE EVALUATION: Maximum or significant modification of tasks or assist is necessary to complete an evaluation.  OT OCCUPATIONAL PROFILE AND HISTORY: Comprehensive assessment: Review of records and extensive additional review of physical, cognitive, psychosocial history related to current functional performance.  CLINICAL DECISION MAKING: High - multiple treatment options, significant modification of task necessary  REHAB POTENTIAL: Good  EVALUATION COMPLEXITY: High    PLAN:  OT FREQUENCY:  17 visits  OT DURATION: 6 weeks  PLANNED INTERVENTIONS: self care/ADL training, therapeutic exercise, therapeutic activity, neuromuscular re-education, passive range of motion, balance training, and functional mobility training  RECOMMENDED OTHER SERVICES: N/A  CONSULTED AND AGREED WITH PLAN OF CARE: Patient and family member/caregiver  PLAN FOR NEXT SESSION: Berg Balance Test, and initiate treatment   Harrel Carina, MS, OTR/L  02/21/2023, 3:39 PM

## 2023-02-21 NOTE — Telephone Encounter (Signed)
Please call patient/wife and let them know that we had gotten a rehab referral for vestibular rehab.  However, I think that the near syncope is more likely from his blood pressure and it is likely it is dropping during physical therapy.  He should follow-up with primary care or whoever is prescribing the hydrochlorothiazide and he should talk to them about the dizziness.

## 2023-02-21 NOTE — Telephone Encounter (Signed)
Patient sent to [facility] for LSVT and he has started this program. I received a referral for Vestibular Rehab for this patient and I called to ask what was happing to this patient to warrant this type of rehab and I was told the PT had sent a referral upfront to be sent to our office because patient was lightheaded and feeling like he would pass out at therapy and they were wanting to add this to his existing therapies

## 2023-02-22 NOTE — Telephone Encounter (Signed)
Bringing patient in Wed at 1 for a orthostatics

## 2023-02-25 ENCOUNTER — Ambulatory Visit: Payer: PPO | Admitting: Occupational Therapy

## 2023-02-25 DIAGNOSIS — M6281 Muscle weakness (generalized): Secondary | ICD-10-CM

## 2023-02-25 NOTE — Therapy (Addendum)
OUTPATIENT OCCUPATIONAL THERAPY NEURO TREATMENT  Patient Name: Darren Sharp MRN: [id number] DOB:[birth year], 77 y.o., male Today's Date: 02/25/2023  PCP: Bethann Punches, MD REFERRING PROVIDER: Kerin Salen, MD  END OF SESSION:   OT End of Session - 02/25/23 1652     Visit Number 6    Number of Visits 17    Date for OT Re-Evaluation 03/27/23    OT Start Time 1300    OT Stop Time 1400    OT Time Calculation (min) 60 min    Activity Tolerance Patient tolerated treatment well    Behavior During Therapy [facility] for tasks assessed/performed                       Past Medical History:  Diagnosis Date   COPD (chronic obstructive pulmonary disease) (HCC)    COPD (chronic obstructive pulmonary disease) (HCC)    Diabetes mellitus without complication (HCC)    Hypertension    Past Surgical History:  Procedure Laterality Date   APPENDECTOMY     COLONOSCOPY WITH PROPOFOL N/A 06/05/2018   Procedure: COLONOSCOPY WITH PROPOFOL;  Surgeon: Christena Deem, MD;  Location: [facility] ENDOSCOPY;  Service: Endoscopy;  Laterality: N/A;   ESOPHAGOGASTRODUODENOSCOPY (EGD) WITH PROPOFOL N/A 06/05/2018   Procedure: ESOPHAGOGASTRODUODENOSCOPY (EGD) WITH PROPOFOL;  Surgeon: Christena Deem, MD;  Location: [facility] ENDOSCOPY;  Service: Endoscopy;  Laterality: N/A;   EYE SURGERY     HERNIA REPAIR     KNEE CAPSULOTOMY Right    Patient Active Problem List   Diagnosis Date Noted   Fever of unknown origin (FUO), generalized weakness 02/21/2022   Weakness    Parkinson's disease 02/19/2022   Major depressive disorder, recurrent, mild 02/19/2022   Visual disturbance as complication of stroke 02/19/2022   PAD (peripheral artery disease) 01/11/2020   Tubular adenoma 12/30/2019   Aortic atherosclerosis 09/14/2019   Benign essential hypertension 09/14/2019   DM type 2 with diabetic mixed hyperlipidemia 09/14/2019   Medicare annual wellness visit, initial 09/14/2019   Panlobular emphysema 09/14/2019    Hypertension 01/28/2017   Hyperlipidemia 01/28/2017   Bronchitis 01/28/2017   History of COPD 01/28/2017   Hx of retinal detachment 01/28/2017   Hx of diabetes mellitus 01/28/2017    ONSET DATE: 12/2021  REFERRING DIAG: Parkinson's  THERAPY DIAG:  Muscle weakness (generalized)  Rationale for Evaluation and Treatment: Rehabilitation  SUBJECTIVE:   SUBJECTIVE STATEMENT:  Pt. has requested an appointment change for the Wednesday as he was able to secure an appointment with his Neurologist 2/2 to Pt. reports of episodes of increased dizziness triggered by changes in position.   Pt accompanied by: family member  PERTINENT HISTORY: Pt. Is a 77 y.o. male who was diagnosed with Parkinson's Disease in February of 2023. Pt. Was hospitalized with encephalopathy in 12/2021. Pt. PMHx includes: HTN, DM Type II, Macular Degeneration of the right eye, Stroke behind the left eye.   PRECAUTIONS: None  WEIGHT BEARING RESTRICTIONS: No  PAIN:  Are you having pain? No  FALLS: Has patient fallen in last 6 months? No, one episode of a near fall into a wall.   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  OBJECTIVE:   HAND DOMINANCE: Right  ADLs: Overall ADLs: Independent Equipment: Grab bars  IADLs: Light housekeeping: Assists with  taking out the trash, dishes, vacuuming.  Meal Prep: Perfromed light meal prep including soup, sandwiches Community mobility: Drives, does not drive if the weather is bad Medication management: Pt.'s wife assists with Pillbox set-up. Pt. Is responsible for taking meds, sets phone alarm to alert medication time Financial management: No change in the process; Reports shared duties 50% Handwriting: Not legible  MOBILITY STATUS: Independent  with Freezing/Hesitation upon initiation  POSTURE COMMENTS:  rounded shoulders   FUNCTIONAL OUTCOME MEASURES: FOTO: 86  Berg Balance Scale: TBD  TUG: 9 sec.  5x's Sit to Stand: 13 sec.  6 min. Walk Test: 1270 ft.  Freezing of Gait: 7/20Pt.   Berg Balance Test: 50/56  UPPER EXTREMITY ROM:    Active ROM [facility]  Shoulder flexion    Shoulder abduction    Shoulder adduction    Shoulder extension    Shoulder internal rotation    Shoulder external rotation    Elbow flexion    Elbow extension    Wrist flexion    Wrist extension    Wrist ulnar deviation    Wrist radial deviation    Wrist pronation    Wrist supination    (Blank rows = not tested)  UPPER EXTREMITY MMT:     MMT Right eval Left eval  Shoulder flexion 5/5 5/5  Shoulder abduction 5/5 5/5  Shoulder adduction    Shoulder extension    Shoulder internal rotation    Shoulder external rotation    Middle trapezius    Lower trapezius    Elbow flexion 5/5 5/5  Elbow extension 5/5 5/5  Wrist flexion    Wrist extension 5/5 5/5  Wrist ulnar deviation    Wrist radial deviation    Wrist pronation    Wrist supination    (Blank rows = not tested)  HAND FUNCTION: Grip strength: Right: 54 lbs; Left: 55 lbs, Lateral pinch: Right: 25 lbs, Left: 27 lbs, and 3 point pinch: Right: 20 lbs, Left: 18 lbs  COORDINATION: 9 Hole Peg test: Right: 36 sec; Left: 32 sec  SENSATION: Not tested  EDEMA: N/A  COGNITION: Overall cognitive status: Within functional limits for tasks assessed  VISION: Subjective report:  Baseline vision: Wears glasses Visual history: macular degeneration: right eye, History of stroke behind left eye  VISION ASSESSMENT: To be further assessed in functional context  PRAXIS: WFL   TODAY'S TREATMENT:                                                                                                                              DATE: 02/25/2023   LSVT: Patient seen for LSVT Daily  Session Maximal Daily Exercises for facilitation/coordination of movement Maximum Sustained Movements are designed to rescale the amplitude of movement output for generalization to daily functional activities. Performed as follows for 1 set of 10 repetitions each multi-directional sustained movements: 1) Floor to ceiling- Pt. continues to require fewer cues for movement patterns, hand  position, and posture. Pt. Performed the task with bilateral hand flicks with emphasis placed on digit extension, and thumb abduction. 2) Side to side multidirectional- Pt. worked on alternating sides with every repetition. Pt. Performed the task with bilateral hand flicks  with cues for digit extension, and thumb abduction.  Repetitive movements performed in standing and are designed to provide retraining effort needed for sustained muscle activation in tasks. Performed as follows for 1 set of 10 repetitions each of multi-directional repetitive movements:  The movements were performed in the standard position, with a chair available.  3) Step and reach forward step-  One slight LOB while forward stepping with the LLE. 4) Step and reach sideways step-  Pt. required cues to keep the BUEs symmetrical. 5) Step and reach backwards step- Pt. required fewer cues initially for each side, and was able to sustain the task without cues.  6) Rock and reach forward/backward- Pt. required fewer cues initially for each side.    7) Rock and reach sideways- Pt. requred cues for UE position, and weightshift  during trunk rotation, elongation, and turning.  Functional Component Task:  Sit to stand: Reps were  performed with visual, and verbal cues for amplitude of arm movement, upright posture, and armswing.  Posterior Arm swing: Pt. worked on increasing the amplitude of bilateral UE armswing with feedback posteriorly using the wall. Pt. worked on ambulating while using two 1.5# dowels for feedback while alternating bilateral arm  swing for 240ft.  Balance:  Pt. worked on standing balance skills on the [employer]. Pt. worked on facing, and holding onto a bar while reaching up with 1 hand to perform reaching tasks wile crossing midline. Pt. alternated hands during the task. A dual tasking component was added to increase the challenge of the task. Pt. worked on reps of balance without holding on to the bar.    BIG ambulation:    Pt. performed 1155 ft. Of BIG ambulation with increased cues for form, and amplitude of BUE, and LE movements.    PATIENT EDUCATION: Education details: OT services POC, Goals Person educated: Patient and Spouse Education method: Explanation, Demonstration, Tactile cues, and Verbal cues Education comprehension: verbalized understanding and returned demonstration  HOME EXERCISE PROGRAM:  Continue to provide ongoing HEP training as needed.   GOALS: Goals reviewed with patient? Yes  SHORT TERM GOALS: Target date: 03/06/2023      Pt. Will demonstrate independence with HEPs  Baseline: Eval: No current HEP Goal status: INITIAL   LONG TERM GOALS: Target date: 03/27/2023    1.  Pt. will increase FOTO score by 1 point to reflect Pt. perceived improvement with assessment specific ADL/IADL's.  Baseline: Eval: FOTO: 86 Goal status: INITIAL  2.  Pt. Will improve right hand [facility] skills by 2 sec. Of speed for improved control with manipulation of small objects. Baseline: Eval: R: 36 sec. L: 32 sec. Goal status: INITIAL  3.  Pt. Will improve 6 min. walk test score by 100 ft. With minimal cues for improved amplitude of movements. Baseline: Eval: 1270 ft. In 6 min. Goal status: INITIAL  4.  Pt. Will demonstrate improved dynamic standing balance during daily ADL/IADL tasks.  Baseline: Eval: Berg Balance Test: TBD Goal status: INITIAL  5.  Pt. Will improve writing legibility to be able to print his first name. Baseline: Eval: Not legible Goal status:  INITIAL  ASSESSMENT:  CLINICAL IMPRESSION:  Pt. reports no episodes of dizziness during the session today. Pt. was able  to perform the standard version of the Maximal Daily Exercises today. A chair was placed closed by, however, Pt. Only needed it occasionally to steady himself. Pt. presented with improved bilateral arm swing posteriorly during BIG ambulation, however required fewer verbal cues. Pt. continues to require mirroring of each of the Maximal Daily Exercises with verbal cues for form, and to increase the amplitude of the movements. Pt. required fewer cues for most of the Maximal Daily Exercises, however required increased cues for the backwards step, and reach. Pt. was able to maintain balance on the BOSU board when challenged with reaching across midline, as well as when challenged with a dual tasking component during the task. Pt. was able to maintain balance on the BOSU longer today when challenged without using hands for support. Pt. continues to benefit from OT services to work towards improving the amplitude of movements, improving balance, reducing risk of falls, and maximizing independence, and efficiency with ADLs, and IADL.  PERFORMANCE DEFICITS: in functional skills including ADLs, IADLs, dexterity, ROM, strength, Fine motor control, Gross motor control, balance, body mechanics, and endurance, cognitive skills including attention and safety awareness, and psychosocial skills including environmental adaptation and routines and behaviors.   IMPAIRMENTS: are limiting patient from ADLs, IADLs, work, and leisure.   CO-MORBIDITIES: may have co-morbidities  that affects occupational performance. Patient will benefit from skilled OT to address above impairments and improve overall function.  MODIFICATION OR ASSISTANCE TO COMPLETE EVALUATION: Maximum or significant modification of tasks or assist is necessary to complete an evaluation.  OT OCCUPATIONAL PROFILE AND HISTORY: Comprehensive  assessment: Review of records and extensive additional review of physical, cognitive, psychosocial history related to current functional performance.  CLINICAL DECISION MAKING: High - multiple treatment options, significant modification of task necessary  REHAB POTENTIAL: Good  EVALUATION COMPLEXITY: High    PLAN:  OT FREQUENCY:  17 visits  OT DURATION: 6 weeks  PLANNED INTERVENTIONS: self care/ADL training, therapeutic exercise, therapeutic activity, neuromuscular re-education, passive range of motion, balance training, and functional mobility training  RECOMMENDED OTHER SERVICES: N/A  CONSULTED AND AGREED WITH PLAN OF CARE: Patient and family member/caregiver  PLAN FOR NEXT SESSION: Berg Balance Test, and initiate treatment   Olegario Messier, MS, OTR/L  02/25/2023, 4:57 PM

## 2023-02-26 ENCOUNTER — Ambulatory Visit: Payer: PPO | Admitting: Occupational Therapy

## 2023-02-26 DIAGNOSIS — M6281 Muscle weakness (generalized): Secondary | ICD-10-CM

## 2023-02-26 NOTE — Therapy (Addendum)
OUTPATIENT OCCUPATIONAL THERAPY NEURO TREATMENT  Patient Name: Darren Sharp MRN: [id number] DOB:12-Oct-1946, 77 y.o., male Today's Date: 02/26/2023  PCP: Bethann Punches, MD REFERRING PROVIDER: Kerin Salen, MD  END OF SESSION:   OT End of Session - 02/26/23 1626     Visit Number 7    Number of Visits 17    Date for OT Re-Evaluation 03/27/23    OT Start Time 1300    OT Stop Time 1400    OT Time Calculation (min) 60 min    Activity Tolerance Patient tolerated treatment well    Behavior During Therapy [facility] for tasks assessed/performed                       Past Medical History:  Diagnosis Date   COPD (chronic obstructive pulmonary disease) (HCC)    COPD (chronic obstructive pulmonary disease) (HCC)    Diabetes mellitus without complication (HCC)    Hypertension    Past Surgical History:  Procedure Laterality Date   APPENDECTOMY     COLONOSCOPY WITH PROPOFOL N/A 06/05/2018   Procedure: COLONOSCOPY WITH PROPOFOL;  Surgeon: Christena Deem, MD;  Location: [facility] ENDOSCOPY;  Service: Endoscopy;  Laterality: N/A;   ESOPHAGOGASTRODUODENOSCOPY (EGD) WITH PROPOFOL N/A 06/05/2018   Procedure: ESOPHAGOGASTRODUODENOSCOPY (EGD) WITH PROPOFOL;  Surgeon: Christena Deem, MD;  Location: [facility] ENDOSCOPY;  Service: Endoscopy;  Laterality: N/A;   EYE SURGERY     HERNIA REPAIR     KNEE CAPSULOTOMY Right    Patient Active Problem List   Diagnosis Date Noted   Fever of unknown origin (FUO), generalized weakness 02/21/2022   Weakness    Parkinson's disease 02/19/2022   Major depressive disorder, recurrent, mild 02/19/2022   Visual disturbance as complication of stroke 02/19/2022   PAD (peripheral artery disease) 01/11/2020   Tubular adenoma 12/30/2019   Aortic atherosclerosis 09/14/2019   Benign essential hypertension 09/14/2019   DM type 2 with diabetic mixed hyperlipidemia 09/14/2019   Medicare annual wellness visit, initial 09/14/2019   Panlobular emphysema 09/14/2019    Hypertension 01/28/2017   Hyperlipidemia 01/28/2017   Bronchitis 01/28/2017   History of COPD 01/28/2017   Hx of retinal detachment 01/28/2017   Hx of diabetes mellitus 01/28/2017    ONSET DATE: 12/2021  REFERRING DIAG: Parkinson's  THERAPY DIAG:  Muscle weakness (generalized)  Rationale for Evaluation and Treatment: Rehabilitation  SUBJECTIVE:   SUBJECTIVE STATEMENT:  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 accompanied by: family member  PERTINENT HISTORY: Pt. Is a 77 y.o. male who was diagnosed with Parkinson's Disease in February of 2023. Pt. Was hospitalized with encephalopathy in 12/2021. Pt. PMHx includes: HTN, DM Type II, Macular Degeneration of the right eye, Stroke behind the left eye.   PRECAUTIONS: None  WEIGHT BEARING RESTRICTIONS: No  PAIN:  Are you having pain? 3/10 pain in the left shoulder   FALLS: Has patient fallen in last 6 months? No, one episode of a near fall into a wall.   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  OBJECTIVE:   HAND DOMINANCE: Right  ADLs: Overall ADLs: Independent Equipment: Grab bars  IADLs: Light housekeeping: Assists with taking out the trash, dishes, vacuuming.  Meal Prep: Perfromed light meal prep including soup,  sandwiches Community mobility: Drives, does not drive if the weather is bad Medication management: Pt.'s wife assists with Pillbox set-up. Pt. Is responsible for taking meds, sets phone alarm to alert medication time Financial management: No change in the process; Reports shared duties 50% Handwriting: Not legible  MOBILITY STATUS: Independent with Freezing/Hesitation upon initiation  POSTURE COMMENTS:  rounded shoulders   FUNCTIONAL OUTCOME  MEASURES: FOTO: 86  Berg Balance Scale: TBD  TUG: 9 sec.  5x's Sit to Stand: 13 sec.  6 min. Walk Test: 1270 ft.  Freezing of Gait: 7/20Pt.   Berg Balance Test: 50/56  UPPER EXTREMITY ROM:    Active ROM Right [facility]FL Left [facility]  Shoulder flexion    Shoulder abduction    Shoulder adduction    Shoulder extension    Shoulder internal rotation    Shoulder external rotation    Elbow flexion    Elbow extension    Wrist flexion    Wrist extension    Wrist ulnar deviation    Wrist radial deviation    Wrist pronation    Wrist supination    (Blank rows = not tested)  UPPER EXTREMITY MMT:     MMT Right eval Left eval  Shoulder flexion 5/5 5/5  Shoulder abduction 5/5 5/5  Shoulder adduction    Shoulder extension    Shoulder internal rotation    Shoulder external rotation    Middle trapezius    Lower trapezius    Elbow flexion 5/5 5/5  Elbow extension 5/5 5/5  Wrist flexion    Wrist extension 5/5 5/5  Wrist ulnar deviation    Wrist radial deviation    Wrist pronation    Wrist supination    (Blank rows = not tested)  HAND FUNCTION: Grip strength: Right: 54 lbs; Left: 55 lbs, Lateral pinch: Right: 25 lbs, Left: 27 lbs, and 3 point pinch: Right: 20 lbs, Left: 18 lbs  COORDINATION: 9 Hole Peg test: Right: 36 sec; Left: 32 sec  SENSATION: Not tested  EDEMA: N/A  COGNITION: Overall cognitive status: Within functional limits for tasks assessed  VISION: Subjective report:  Baseline vision: Wears glasses Visual history: macular degeneration: right eye, History of stroke behind left eye  VISION ASSESSMENT: To be further assessed in functional context  PRAXIS: WFL   TODAY'S TREATMENT:                                                                                                                              DATE: 02/26/2023   LSVT: Patient seen for LSVT Daily Session Maximal Daily Exercises for facilitation/coordination of movement Maximum Sustained Movements are  designed to rescale the amplitude of movement output for generalization to daily functional activities. Performed as follows for 1 set of 10 repetitions each multi-directional sustained movements: 1) Floor to ceiling- Pt. continues to require fewer cues for movement patterns, hand position, and posture. Pt. Performed the task with bilateral hand flicks with emphasis placed on  digit extension, and thumb abduction. 2) Side to side multidirectional- Pt. worked on alternating sides with every repetition. Pt. Performed the task with bilateral hand flicks  with cues for digit extension, and thumb abduction.  Repetitive movements performed in standing and are designed to provide retraining effort needed for sustained muscle activation in tasks. Performed as follows for 1 set of 10 repetitions each of multi-directional repetitive movements:  The movements were performed in the standard position, with a chair available.  3) Step and reach forward step-  No LOB while forward step, and reach. 4) Step and reach sideways step-  Pt. required  fewer cues to keep the BUEs symmetrical. 5) Step and reach backwards step- Pt. Continues to require fewer cues this week initially for each side, and was able to sustain the task without cues.  6) Rock and reach forward/backward- Pt. Continues to require fewer cues initially for each side. Pt. Presented with more upright posture     7) Rock and reach sideways- Pt. requred cues for UE position, and weightshift  during trunk rotation, elongation, and turning.  Functional Component Task:  Sit to stand: Reps were  performed with visual, and verbal cues for amplitude of arm movement, upright posture, and armswing.  Posterior Arm swing: Pt. worked on increasing the amplitude of bilateral UE armswing with feedback posteriorly using the wall with 3# weights. Pt. worked on ambulating while using one 1.5# dowel for feedback while alternating bilateral arm swing for 230 ft. For the  right followed by the left.  Balance:  Pt. worked on standing balance with the right, followed by the left while tapping cones in a semi-circle with the right foot. Pt. worked on performing high knees prior to, and after tapping the cones. Pt. worked on tapping cones following a  verbal sequence of 4-5 colors. Pt. worked on alternating tapping with the right, and left foot. Pt. worked on stepping one foot in front of the other back and forth in a line for 3 trials.  Stepping over objects:   Pt. worked on stepping over a series of cones with emphasis placed on increasing the amplitude of movements.  BIG ambulation:    Pt. performed 1155 ft. Of BIG ambulation with increased cues for form, and amplitude of BUE, and LE movements.    PATIENT EDUCATION: Education details: OT services POC, Goals Person educated: Patient and Spouse Education method: Explanation, Demonstration, Tactile cues, and Verbal cues Education comprehension: verbalized understanding and returned demonstration  HOME EXERCISE PROGRAM:  Continue to provide ongoing HEP training as needed.   GOALS: Goals reviewed with patient? Yes  SHORT TERM GOALS: Target date: 03/06/2023      Pt. Will demonstrate independence with HEPs  Baseline: Eval: No current HEP Goal status: INITIAL   LONG TERM GOALS: Target date: 03/27/2023    1.  Pt. will increase FOTO score by 1 point to reflect Pt. perceived improvement with assessment specific ADL/IADL's.  Baseline: Eval: FOTO: 86 Goal status: INITIAL  2.  Pt. Will improve right hand [facility] skills by 2 sec. Of speed for improved control with manipulation of small objects. Baseline: Eval: R: 36 sec. L: 32 sec. Goal status: INITIAL  3.  Pt. Will improve 6 min. walk test score by 100 ft. With minimal cues for improved amplitude of movements. Baseline: Eval: 1270 ft. In 6 min. Goal status: INITIAL  4.  Pt. Will demonstrate improved dynamic standing balance during daily ADL/IADL tasks.   Baseline: Eval: Berg Balance Test: TBD  Goal status: INITIAL  5.  Pt. Will improve writing legibility to be able to print his first name. Baseline: Eval: Not legible Goal status: INITIAL  ASSESSMENT:  CLINICAL IMPRESSION:  Pt. reports having having left shoulder soreness. LUE position, and the shoulder was monitored during the Maximal Daily Exercises, and modifications were made. Pt. reports no episodes of dizziness during the session today. Pt. continues to be able to perform the standard version of the Maximal Daily Exercises today. A chair was placed close by, however, Pt. Rarely needed it to steady himself during the exercises today. Pt. present with improving bilateral arm swing posteriorly during BIG ambulation, however required fewer verbal cues. Pt. continues to require mirroring of each of the Maximal Daily Exercises with verbal cues for form, and to increase the amplitude of the movements. Pt. required fewer cues for most of the Maximal Daily Exercises, however required increased cues for the backwards step, and reach. Pt. was able to consistently follow a series of tapping 4/5 colors in a verbal sequence. Pt. was able to complete 2 trials of stepping one foot in front of the other without LOB.Pt. presented with LOB during the 3rd trial, however was able to independently self-correct  Pt. continues to benefit from OT services to work towards improving the amplitude of movements, improving balance, reducing risk of falls, and maximizing independence, and efficiency with ADLs, and IADL.  PERFORMANCE DEFICITS: in functional skills including ADLs, IADLs, dexterity, ROM, strength, Fine motor control, Gross motor control, balance, body mechanics, and endurance, cognitive skills including attention and safety awareness, and psychosocial skills including environmental adaptation and routines and behaviors.   IMPAIRMENTS: are limiting patient from ADLs, IADLs, work, and leisure.   CO-MORBIDITIES:  may have co-morbidities  that affects occupational performance. Patient will benefit from skilled OT to address above impairments and improve overall function.  MODIFICATION OR ASSISTANCE TO COMPLETE EVALUATION: Maximum or significant modification of tasks or assist is necessary to complete an evaluation.  OT OCCUPATIONAL PROFILE AND HISTORY: Comprehensive assessment: Review of records and extensive additional review of physical, cognitive, psychosocial history related to current functional performance.  CLINICAL DECISION MAKING: High - multiple treatment options, significant modification of task necessary  REHAB POTENTIAL: Good  EVALUATION COMPLEXITY: High    PLAN:  OT FREQUENCY:  17 visits  OT DURATION: 6 weeks  PLANNED INTERVENTIONS: self care/ADL training, therapeutic exercise, therapeutic activity, neuromuscular re-education, passive range of motion, balance training, and functional mobility training  RECOMMENDED OTHER SERVICES: N/A  CONSULTED AND AGREED WITH PLAN OF CARE: Patient and family member/caregiver  PLAN FOR NEXT SESSION: Berg Balance Test, and initiate treatment   Olegario Messier, MS, OTR/L  02/26/2023, 4:28 PM

## 2023-02-27 ENCOUNTER — Ambulatory Visit: Payer: PPO

## 2023-02-27 ENCOUNTER — Telehealth: Payer: Self-pay

## 2023-02-27 ENCOUNTER — Ambulatory Visit: Payer: PPO | Admitting: Occupational Therapy

## 2023-02-27 DIAGNOSIS — M6281 Muscle weakness (generalized): Secondary | ICD-10-CM

## 2023-02-27 DIAGNOSIS — R278 Other lack of coordination: Secondary | ICD-10-CM

## 2023-02-27 NOTE — Telephone Encounter (Signed)
Friday L 145/74 - 62 Sit 148/86 - 69 Stand 150/81 -65  Saturday  L 148/75 - 61 Sit 150/81-65 Stand 153/76 -69  Sunday  L 127/63 -70 Sit 124-64-80 Stand 120/60-60  Mon L 137/72-73 Sit 134/75-70 Stand 133/69 -67 '  Patients wife wanted me to let Dr. Arbutus Leas know they will be going to [place] on May 22nd and 23rd. If any testing or anything needed to be done to just let them know

## 2023-02-27 NOTE — Therapy (Signed)
OUTPATIENT OCCUPATIONAL THERAPY NEURO TREATMENT  Patient Name: Darren Sharp MRN: [id number] DOB:12-22-1945, 77 y.o., male Today's Date: 02/27/2023  PCP: Bethann Punches, MD REFERRING PROVIDER: Kerin Salen, MD  END OF SESSION:   OT End of Session - 02/27/23 1710     Visit Number 8    Number of Visits 17    Date for OT Re-Evaluation 03/27/23    OT Start Time 0915    OT Stop Time 1010    OT Time Calculation (min) 55 min    Activity Tolerance Patient tolerated treatment well    Behavior During Therapy WFL for tasks assessed/performed                       Past Medical History:  Diagnosis Date   COPD (chronic obstructive pulmonary disease)    COPD (chronic obstructive pulmonary disease)    Diabetes mellitus without complication    Hypertension    Past Surgical History:  Procedure Laterality Date   APPENDECTOMY     COLONOSCOPY WITH PROPOFOL N/A 06/05/2018   Procedure: COLONOSCOPY WITH PROPOFOL;  Surgeon: Christena Deem, MD;  Location: [facility] ENDOSCOPY;  Service: Endoscopy;  Laterality: N/A;   ESOPHAGOGASTRODUODENOSCOPY (EGD) WITH PROPOFOL N/A 06/05/2018   Procedure: ESOPHAGOGASTRODUODENOSCOPY (EGD) WITH PROPOFOL;  Surgeon: Christena Deem, MD;  Location: [facility] ENDOSCOPY;  Service: Endoscopy;  Laterality: N/A;   EYE SURGERY     HERNIA REPAIR     KNEE CAPSULOTOMY Right    Patient Active Problem List   Diagnosis Date Noted   Fever of unknown origin (FUO), generalized weakness 02/21/2022   Weakness    Parkinson's disease 02/19/2022   Major depressive disorder, recurrent, mild 02/19/2022   Visual disturbance as complication of stroke 02/19/2022   PAD (peripheral artery disease) 01/11/2020   Tubular adenoma 12/30/2019   Aortic atherosclerosis 09/14/2019   Benign essential hypertension 09/14/2019   DM type 2 with diabetic mixed hyperlipidemia 09/14/2019   Medicare annual wellness visit, initial 09/14/2019   Panlobular emphysema 09/14/2019   Hypertension 01/28/2017    Hyperlipidemia 01/28/2017   Bronchitis 01/28/2017   History of COPD 01/28/2017   Hx of retinal detachment 01/28/2017   Hx of diabetes mellitus 01/28/2017    ONSET DATE: 12/2021  REFERRING DIAG: Parkinson's  THERAPY DIAG:  Muscle weakness (generalized)  Other lack of coordination  Rationale for Evaluation and Treatment: Rehabilitation  SUBJECTIVE:   SUBJECTIVE STATEMENT:  Pt. 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 accompanied by: family member  PERTINENT HISTORY: Pt. Is a 77 y.o. male who was diagnosed with Parkinson's Disease in February of 2023. Pt. Was hospitalized with encephalopathy in 12/2021. Pt. PMHx includes: HTN, DM Type II, Macular Degeneration of the right eye, Stroke behind the left eye.   PRECAUTIONS: None  WEIGHT BEARING RESTRICTIONS: No  PAIN:  Are you having pain? No pain in the left shoulder   FALLS: Has patient fallen in last 6 months? No, one episode of a near fall into a wall.   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  OBJECTIVE:   HAND DOMINANCE: Right  ADLs: Overall ADLs: Independent Equipment: Grab bars  IADLs: Light housekeeping: Assists with taking out the trash, dishes, vacuuming.  Meal Prep: Perfromed light  meal prep including soup, sandwiches Community mobility: Drives, does not drive if the weather is bad Medication management: Pt.'s wife assists with Pillbox set-up. Pt. Is responsible for taking meds, sets phone alarm to alert medication time Financial management: No change in the process; Reports shared duties 50% Handwriting: Not legible  MOBILITY STATUS: Independent with Freezing/Hesitation upon initiation  POSTURE COMMENTS:  rounded shoulders    FUNCTIONAL OUTCOME MEASURES: FOTO: 86  Berg Balance Scale: TBD  TUG: 9 sec.  5x's Sit to Stand: 13 sec.  6 min. Walk Test: 1270 ft.  Freezing of Gait: 7/20Pt.   Berg Balance Test: 50/56  UPPER EXTREMITY ROM:    Active ROM Right WFL Left [facility]  Shoulder flexion    Shoulder abduction    Shoulder adduction    Shoulder extension    Shoulder internal rotation    Shoulder external rotation    Elbow flexion    Elbow extension    Wrist flexion    Wrist extension    Wrist ulnar deviation    Wrist radial deviation    Wrist pronation    Wrist supination    (Blank rows = not tested)  UPPER EXTREMITY MMT:     MMT Right eval Left eval  Shoulder flexion 5/5 5/5  Shoulder abduction 5/5 5/5  Shoulder adduction    Shoulder extension    Shoulder internal rotation    Shoulder external rotation    Middle trapezius    Lower trapezius    Elbow flexion 5/5 5/5  Elbow extension 5/5 5/5  Wrist flexion    Wrist extension 5/5 5/5  Wrist ulnar deviation    Wrist radial deviation    Wrist pronation    Wrist supination    (Blank rows = not tested)  HAND FUNCTION: Grip strength: Right: 54 lbs; Left: 55 lbs, Lateral pinch: Right: 25 lbs, Left: 27 lbs, and 3 point pinch: Right: 20 lbs, Left: 18 lbs  COORDINATION: 9 Hole Peg test: Right: 36 sec; Left: 32 sec  SENSATION: Not tested  EDEMA: N/A  COGNITION: Overall cognitive status: Within functional limits for tasks assessed  VISION: Subjective report:  Baseline vision: Wears glasses Visual history: macular degeneration: right eye, History of stroke behind left eye  VISION ASSESSMENT: To be further assessed in functional context  PRAXIS: WFL   TODAY'S TREATMENT:                                                                                                                              DATE: 02/27/2023   LSVT: Patient seen for LSVT Daily Session Maximal Daily Exercises for facilitation/coordination of movement Maximum  Sustained Movements are designed to rescale the amplitude of movement output for generalization to daily functional activities. Performed as follows for 1 set of 10 repetitions each multi-directional sustained movements: 1) Floor to ceiling- Pt. continues to require fewer cues for movement patterns, hand position, and posture. Pt. Performed the task with bilateral hand flicks  with emphasis placed on digit extension, and thumb abduction. 2) Side to side multidirectional- Pt. worked on alternating sides with every repetition. Pt. Performed the task with bilateral hand flicks  with cues for digit extension, and thumb abduction.  Repetitive movements performed in standing and are designed to provide retraining effort needed for sustained muscle activation in tasks. Performed as follows for 1 set of 10 repetitions each of multi-directional repetitive movements:  The movements were performed in the standard position, with a chair available.  3) Step and reach forward step-  No LOB while forward step, and reach. 4) Step and reach sideways step-  Pt. required  fewer cues to keep the BUEs symmetrical. 5) Step and reach backwards step- Pt. Continues to require fewer cues this week initially for each side, and was able to sustain the task without cues.  6) Rock and reach forward/backward- Pt. Continues to require fewer cues initially for each side. Pt. Presented with more upright posture     7) Rock and reach sideways- Pt. requred cues for UE position, and weightshift  during trunk rotation, elongation, and turning.  Functional Component Task:  Sit to stand: Reps were  performed with visual, and verbal cues for amplitude of arm movement, upright posture, and armswing.  Posterior Arm swing: Pt. worked on increasing the amplitude of bilateral UE armswing with feedback posteriorly using the wall with 3# weights. For the right followed by the left.  Balance:  Pt. Worked on standing balance using the BOSU  semi-circular balance board in the parallel bars for 3 trials: 1 min., 34 sec., and 1 min. Without holding on to the parallel. Pt. Worked on standing balance while on the floor rocker in the parallel bars. Pt. presents with posterior leaning, and required assist for righting to midline. Pt. worked on stepping one foot in front of the other back and forth in a line for 3 trials.  Stepping over objects:   Pt. worked on stepping over a series of cones with emphasis placed on increasing the amplitude of movements. Pt. Worked on a series of marching in place with cues, and assist for high knees using a yard stick for knee height feedback. Pt. Worked on marching 237ft with emphasis placed on increasing the amplitude of bilateral knee height.  BIG ambulation:    Pt. performed 1155 ft. Of BIG ambulation with increased cues for form, and amplitude of BUE, and LE movements.    PATIENT EDUCATION: Education details: OT services POC, Goals Person educated: Patient and Spouse Education method: Explanation, Demonstration, Tactile cues, and Verbal cues Education comprehension: verbalized understanding and returned demonstration  HOME EXERCISE PROGRAM:  Continue to provide ongoing HEP training as needed.   GOALS: Goals reviewed with patient? Yes  SHORT TERM GOALS: Target date: 03/06/2023      Pt. Will demonstrate independence with HEPs  Baseline: Eval: No current HEP Goal status: INITIAL   LONG TERM GOALS: Target date: 03/27/2023    1.  Pt. will increase FOTO score by 1 point to reflect Pt. perceived improvement with assessment specific ADL/IADL's.  Baseline: Eval: FOTO: 86 Goal status: INITIAL  2.  Pt. Will improve right hand [facility] skills by 2 sec. Of speed for improved control with manipulation of small objects. Baseline: Eval: R: 36 sec. L: 32 sec. Goal status: INITIAL  3.  Pt. Will improve 6 min. walk test score by 100 ft. With minimal cues for improved amplitude of movements. Baseline:  Eval: 1270 ft. In 6 min. Goal  status: INITIAL  4.  Pt. Will demonstrate improved dynamic standing balance during daily ADL/IADL tasks.  Baseline: Eval: Berg Balance Test: TBD Goal status: INITIAL  5.  Pt. Will improve writing legibility to be able to print his first name. Baseline: Eval: Not legible Goal status: INITIAL  ASSESSMENT:  CLINICAL IMPRESSION:  Pt. reports no left shoulder soreness today. Pt. reports no episodes of dizziness during the session today. Pt. continues to be able to perform the standard version of the Maximal Daily Exercises today. A chair was placed close by, however, Pt. Continues to rarely needs the chair to steady himself during the exercises today. Pt. continues to present with improving bilateral arm swing posteriorly during BIG ambulation, however required fewer verbal cues. Pt. continues to require mirroring of each of the Maximal Daily Exercises with verbal cues for form, and to increase the amplitude of the movements. Pt. required fewer cues for most of the Maximal Daily Exercises. Pt. presented with increased steadiness during the backwards step, and reach.  Pt. was able to increase unsupported standing time for 3 trials on the BOSU board. Pt. presented with retropulsion on the anterior/posterior requiring minimal assist to right self. Pt. continues to benefit from OT services to work towards improving the amplitude of movements, improving balance, reducing risk of falls, and maximizing independence, and efficiency with ADLs, and IADL.  PERFORMANCE DEFICITS: in functional skills including ADLs, IADLs, dexterity, ROM, strength, Fine motor control, Gross motor control, balance, body mechanics, and endurance, cognitive skills including attention and safety awareness, and psychosocial skills including environmental adaptation and routines and behaviors.   IMPAIRMENTS: are limiting patient from ADLs, IADLs, work, and leisure.   CO-MORBIDITIES: may have co-morbidities   that affects occupational performance. Patient will benefit from skilled OT to address above impairments and improve overall function.  MODIFICATION OR ASSISTANCE TO COMPLETE EVALUATION: Maximum or significant modification of tasks or assist is necessary to complete an evaluation.  OT OCCUPATIONAL PROFILE AND HISTORY: Comprehensive assessment: Review of records and extensive additional review of physical, cognitive, psychosocial history related to current functional performance.  CLINICAL DECISION MAKING: High - multiple treatment options, significant modification of task necessary  REHAB POTENTIAL: Good  EVALUATION COMPLEXITY: High    PLAN:  OT FREQUENCY:  17 visits  OT DURATION: 6 weeks  PLANNED INTERVENTIONS: self care/ADL training, therapeutic exercise, therapeutic activity, neuromuscular re-education, passive range of motion, balance training, and functional mobility training  RECOMMENDED OTHER SERVICES: N/A  CONSULTED AND AGREED WITH PLAN OF CARE: Patient and family member/caregiver  PLAN FOR NEXT SESSION: Berg Balance Test, and initiate treatment   Olegario Messierlaine Fredrick [place], MS, OTR/L  02/27/2023, 5:13 PM

## 2023-02-28 ENCOUNTER — Telehealth: Payer: Self-pay

## 2023-02-28 ENCOUNTER — Ambulatory Visit: Payer: PPO | Admitting: Occupational Therapy

## 2023-02-28 DIAGNOSIS — M6281 Muscle weakness (generalized): Secondary | ICD-10-CM

## 2023-02-28 DIAGNOSIS — R278 Other lack of coordination: Secondary | ICD-10-CM

## 2023-02-28 NOTE — Telephone Encounter (Signed)
Patients wife wondering if Dr.Tat will need to see [place] before the next appointment due to circumstances revolving around yesterday.

## 2023-02-28 NOTE — Telephone Encounter (Signed)
Called pt and informed him of Dr. Arbutus Leas answer and he understood and said he would see [place] on the 20th of May.

## 2023-02-28 NOTE — Therapy (Signed)
OUTPATIENT OCCUPATIONAL THERAPY NEURO TREATMENT  Patient Name: Darren Sharp MRN: [id number] DOB:08-04-1946, 77 y.o., male Today's Date: 02/28/2023  PCP: Bethann Punches, MD REFERRING PROVIDER: Kerin Salen, MD  END OF SESSION:   OT End of Session - 02/28/23 1407     Visit Number 9    Number of Visits 17    Date for OT Re-Evaluation 03/27/23    OT Start Time 1300    OT Stop Time 1400    OT Time Calculation (min) 60 min    Activity Tolerance Patient tolerated treatment well    Behavior During Therapy WFL for tasks assessed/performed                       Past Medical History:  Diagnosis Date   COPD (chronic obstructive pulmonary disease)    COPD (chronic obstructive pulmonary disease)    Diabetes mellitus without complication    Hypertension    Past Surgical History:  Procedure Laterality Date   APPENDECTOMY     COLONOSCOPY WITH PROPOFOL N/A 06/05/2018   Procedure: COLONOSCOPY WITH PROPOFOL;  Surgeon: Christena Deem, MD;  Location: [facility] ENDOSCOPY;  Service: Endoscopy;  Laterality: N/A;   ESOPHAGOGASTRODUODENOSCOPY (EGD) WITH PROPOFOL N/A 06/05/2018   Procedure: ESOPHAGOGASTRODUODENOSCOPY (EGD) WITH PROPOFOL;  Surgeon: Christena Deem, MD;  Location: [facility] ENDOSCOPY;  Service: Endoscopy;  Laterality: N/A;   EYE SURGERY     HERNIA REPAIR     KNEE CAPSULOTOMY Right    Patient Active Problem List   Diagnosis Date Noted   Fever of unknown origin (FUO), generalized weakness 02/21/2022   Weakness    Parkinson's disease 02/19/2022   Major depressive disorder, recurrent, mild 02/19/2022   Visual disturbance as complication of stroke 02/19/2022   PAD (peripheral artery disease) 01/11/2020   Tubular adenoma 12/30/2019   Aortic atherosclerosis 09/14/2019   Benign essential hypertension 09/14/2019   DM type 2 with diabetic mixed hyperlipidemia 09/14/2019   Medicare annual wellness visit, initial 09/14/2019   Panlobular emphysema 09/14/2019   Hypertension 01/28/2017    Hyperlipidemia 01/28/2017   Bronchitis 01/28/2017   History of COPD 01/28/2017   Hx of retinal detachment 01/28/2017   Hx of diabetes mellitus 01/28/2017    ONSET DATE: 12/2021  REFERRING DIAG: Parkinson's  THERAPY DIAG:  Muscle weakness (generalized)  Other lack of coordination  Rationale for Evaluation and Treatment: Rehabilitation  SUBJECTIVE:   SUBJECTIVE STATEMENT:  Pt. reports doing well today.  Pt. accompanied by: family member  PERTINENT HISTORY: Pt. Is a 77 y.o. male who was diagnosed with Parkinson's Disease in February of 2023. Pt. Was hospitalized with encephalopathy in 12/2021. Pt. PMHx includes: HTN, DM Type II, Macular Degeneration of the right eye, Stroke behind the left eye.   PRECAUTIONS: None  WEIGHT BEARING RESTRICTIONS: No  PAIN:  Are you having pain? No pain in the left shoulder   FALLS: Has patient fallen in last 6 months? No, one episode of a near fall into a wall.   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  OBJECTIVE:   HAND DOMINANCE: Right  ADLs: Overall ADLs: Independent Equipment: Grab bars  IADLs: Light housekeeping: Assists with taking out the trash, dishes, vacuuming.  Meal Prep: Perfromed light meal prep including soup, sandwiches Community mobility: Drives, does not drive  if the weather is bad Medication management: Pt.'s wife assists with Pillbox set-up. Pt. Is responsible for taking meds, sets phone alarm to alert medication time Financial management: No change in the process; Reports shared duties 50% Handwriting: Not legible  MOBILITY STATUS: Independent with Freezing/Hesitation upon initiation  POSTURE COMMENTS:  rounded shoulders   FUNCTIONAL OUTCOME MEASURES: FOTO: 86  TUG: 9 sec.  5x's  Sit to Stand: 13 sec.  6 min. Walk Test: 1270 ft.  Freezing of Gait: 7/20   Berg Balance Test: 50/56  UPPER EXTREMITY ROM:    Active ROM Right WFL Left [facility]  Shoulder flexion    Shoulder abduction    Shoulder adduction    Shoulder extension    Shoulder internal rotation    Shoulder external rotation    Elbow flexion    Elbow extension    Wrist flexion    Wrist extension    Wrist ulnar deviation    Wrist radial deviation    Wrist pronation    Wrist supination    (Blank rows = not tested)  UPPER EXTREMITY MMT:     MMT Right eval Left eval  Shoulder flexion 5/5 5/5  Shoulder abduction 5/5 5/5  Shoulder adduction    Shoulder extension    Shoulder internal rotation    Shoulder external rotation    Middle trapezius    Lower trapezius    Elbow flexion 5/5 5/5  Elbow extension 5/5 5/5  Wrist flexion    Wrist extension 5/5 5/5  Wrist ulnar deviation    Wrist radial deviation    Wrist pronation    Wrist supination    (Blank rows = not tested)  HAND FUNCTION: Grip strength: Right: 54 lbs; Left: 55 lbs, Lateral pinch: Right: 25 lbs, Left: 27 lbs, and 3 point pinch: Right: 20 lbs, Left: 18 lbs  COORDINATION: 9 Hole Peg test: Right: 36 sec; Left: 32 sec  SENSATION: Not tested  EDEMA: N/A  COGNITION: Overall cognitive status: Within functional limits for tasks assessed  VISION: Subjective report:  Baseline vision: Wears glasses Visual history: macular degeneration: right eye, History of stroke behind left eye  VISION ASSESSMENT: To be further assessed in functional context  PRAXIS: WFL   TODAY'S TREATMENT:                                                                                                                              DATE: 02/28/2023   LSVT: Patient seen for LSVT Daily Session Maximal Daily Exercises for facilitation/coordination of movement Maximum Sustained Movements are designed to rescale the amplitude of movement output for generalization  to daily functional activities. Performed as follows for 1 set of 10 repetitions each multi-directional sustained movements: 1) Floor to ceiling- Pt. continues to require fewer cues for movement patterns, hand position, and posture. Pt. performed the task with bilateral hand flicks with emphasis placed on digit extension, and thumb abduction. 2) Side to side multidirectional- Pt. worked  on alternating sides with every repetition. Pt. Performed the task with bilateral hand flicks  with cues for digit extension, and thumb abduction.  Repetitive movements performed in standing and are designed to provide retraining effort needed for sustained muscle activation in tasks. Performed as follows for 1 set of 10 repetitions each of multi-directional repetitive movements:  The movements were performed in the standard position, with a chair available.  3) Step and reach forward step-  No LOB while forward step, and reach. 4) Step and reach sideways step-  Pt. required  fewer cues to keep the BUEs symmetrical with no reports of left shoulder discomfort. 5) Step and reach backwards step- Pt. Continues to require fewer cues this week initially for each side, and was able to sustain the task without cues.  6) Rock and reach forward/backward- Pt. Continues to require fewer cues initially for each side. Pt. presented with more upright posture     7) Rock and reach sideways- Pt. requred cues for UE position, and weightshift  during trunk rotation, elongation, and turning.  Functional Component Task:  Sit to stand: Pt. is improving with amplitude of movements during sit to stands this week.   Posterior Arm swing:  Pt. worked on increasing the amplitude of bilateral UE armswing with feedback posteriorly using the wall with 3# weights. For the right followed by the left.  Balance:  Pt. worked on standing balance using the BOSU semi-circular balance board in the parallel bars for 2 trials: 1 min. &15 sec., and 5  min. without holding on to the parallel bars.  Pt. Was able to right self  to midline on the BOSU board with minimal cuing. Pt. worked on stepping one foot in front of the other back and forth following a line for 3 trials, pt. Requires assist and cues to walk  heel to toe on the line.  Stepping over objects:  BIG ambulation:    Pt. performed 1155 ft. Of BIG ambulation with increased cues for form, and amplitude of BUE, and LE movements.    PATIENT EDUCATION: Education details: OT services POC, Goals Person educated: Patient and Spouse Education method: Explanation, Demonstration, Tactile cues, and Verbal cues Education comprehension: verbalized understanding and returned demonstration  HOME EXERCISE PROGRAM:  Continue to provide ongoing HEP training as needed.   GOALS: Goals reviewed with patient? Yes  SHORT TERM GOALS: Target date: 03/06/2023      Pt. Will demonstrate independence with HEPs  Baseline: Eval: No current HEP Goal status: INITIAL   LONG TERM GOALS: Target date: 03/27/2023 *Refer to Functional Outcome Measure section above for additional objective data  1.  Pt. will increase FOTO score by 1 point to reflect Pt. perceived improvement with assessment specific ADL/IADL's.  Baseline: Eval: FOTO: 86 Goal status: INITIAL  2.  Pt. Will [facility] right [facility] skills by 2 sec. Of speed for improved control with manipulation of small objects. Baseline: Eval: R: 36 sec. L: 32 sec. Goal status: INITIAL  3.  Pt. Will improve 6 min. walk test score by 100 ft. With minimal cues for improved amplitude of movements. Baseline: Eval: 1270 ft. In 6 min. Goal status: INITIAL  4.  Pt. Will demonstrate improved dynamic standing balance during daily ADL/IADL tasks.  Baseline: Eval: Berg Balance Test: TBD Goal status: INITIAL  5.  Pt. Will improve writing legibility to be able to print his first name. Baseline: Eval: Not legible Goal status: INITIAL  ASSESSMENT:  CLINICAL  IMPRESSION:  Pt. Was  able to recall the sequence of the  multidirectional sustained movements, however required cues to recall the whole sequence for the  repetitive exercises in standing. Pt. Led the counting during the Maximal Daily Exercises, initiating with each exercise. Pt. reports no left shoulder soreness today. Pt. reports no episodes of dizziness during the session today. Pt. continues to be able to perform the standard version of the Maximal Daily Exercises today. A chair was placed close by, however, Pt. continues to rarely need the chair to steady himself during the exercises today. Pt. continues to present with improving bilateral arm swing posteriorly during BIG ambulation, however required fewer verbal cues.  Pt. continues to require mirroring of each of the Maximal Daily Exercises with verbal cues for form, and to increase the amplitude of the movements. Pt. required fewer cues for most of the Maximal Daily Exercises. Pt. Continues to present with increased steadiness during the backwards step, and reach. Pt. was able to  significantly increase unsupported standing time for 2 trials on the BOSU board. Pt. continues to benefit from OT services to work towards improving the amplitude of movements, improving balance, reducing risk of falls, and maximizing independence, and efficiency with ADLs, and IADL.  PERFORMANCE DEFICITS: in functional skills including ADLs, IADLs, dexterity, ROM, strength, Fine motor control, Gross motor control, balance, body mechanics, and endurance, cognitive skills including attention and safety awareness, and psychosocial skills including environmental adaptation and routines and behaviors.   IMPAIRMENTS: are limiting patient from ADLs, IADLs, work, and leisure.   CO-MORBIDITIES: may have co-morbidities  that affects occupational performance. Patient will benefit from skilled OT to address above impairments and improve overall function.  MODIFICATION OR ASSISTANCE  TO COMPLETE EVALUATION: Maximum or significant modification of tasks or assist is necessary to complete an evaluation.  OT OCCUPATIONAL PROFILE AND HISTORY: Comprehensive assessment: Review of records and extensive additional review of physical, cognitive, psychosocial history related to current functional performance.  CLINICAL DECISION MAKING: High - multiple treatment options, significant modification of task necessary  REHAB POTENTIAL: Good  EVALUATION COMPLEXITY: High    PLAN:  OT FREQUENCY:  17 visits  OT DURATION: 6 weeks  PLANNED INTERVENTIONS: self care/ADL training, therapeutic exercise, therapeutic activity, neuromuscular re-education, passive range of motion, balance training, and functional mobility training  RECOMMENDED OTHER SERVICES: N/A  CONSULTED AND AGREED WITH PLAN OF CARE: Patient and family member/caregiver  PLAN FOR NEXT SESSION:  Plan to complete the reassessment for the 10th visit Progress report  Olegario Messier, MS, OTR/L  02/28/2023, 2:11 PM

## 2023-03-04 ENCOUNTER — Ambulatory Visit: Payer: PPO | Admitting: Occupational Therapy

## 2023-03-04 DIAGNOSIS — R278 Other lack of coordination: Secondary | ICD-10-CM

## 2023-03-04 DIAGNOSIS — M6281 Muscle weakness (generalized): Secondary | ICD-10-CM

## 2023-03-05 ENCOUNTER — Ambulatory Visit: Payer: PPO | Admitting: Occupational Therapy

## 2023-03-05 DIAGNOSIS — M6281 Muscle weakness (generalized): Secondary | ICD-10-CM

## 2023-03-05 DIAGNOSIS — R278 Other lack of coordination: Secondary | ICD-10-CM

## 2023-03-06 ENCOUNTER — Ambulatory Visit: Payer: PPO | Admitting: Occupational Therapy

## 2023-03-06 DIAGNOSIS — M6281 Muscle weakness (generalized): Secondary | ICD-10-CM

## 2023-03-06 DIAGNOSIS — R278 Other lack of coordination: Secondary | ICD-10-CM

## 2023-03-07 ENCOUNTER — Ambulatory Visit: Payer: PPO | Admitting: Occupational Therapy

## 2023-03-07 DIAGNOSIS — R278 Other lack of coordination: Secondary | ICD-10-CM

## 2023-03-07 DIAGNOSIS — M6281 Muscle weakness (generalized): Secondary | ICD-10-CM

## 2023-03-09 ENCOUNTER — Encounter: Payer: Self-pay | Admitting: Occupational Therapy

## 2023-03-09 ENCOUNTER — Other Ambulatory Visit: Payer: Self-pay | Admitting: Neurology

## 2023-03-09 DIAGNOSIS — F330 Major depressive disorder, recurrent, mild: Secondary | ICD-10-CM

## 2023-03-09 DIAGNOSIS — G20A1 Parkinson's disease without dyskinesia, without mention of fluctuations: Secondary | ICD-10-CM

## 2023-03-09 NOTE — Therapy (Signed)
OUTPATIENT OCCUPATIONAL THERAPY NEURO TREATMENT/PROGRESS UPDATE REPORTING PERIOD FROM 02/13/2023 TO 03/04/2023  Patient Name: Darren Sharp MRN: [id number] DOB:01-25-1946, 77 y.o., male   PCP: Bethann Punches, MD REFERRING PROVIDER: Kerin Salen, MD  END OF SESSION:   OT End of Session - 03/09/23 1340     Visit Number 10    Number of Visits 17    Date for OT Re-Evaluation 03/27/23    OT Start Time 1300    OT Stop Time 1359    OT Time Calculation (min) 59 min    Activity Tolerance Patient tolerated treatment well    Behavior During Therapy WFL for tasks assessed/performed                       Past Medical History:  Diagnosis Date   COPD (chronic obstructive pulmonary disease)    COPD (chronic obstructive pulmonary disease)    Diabetes mellitus without complication    Hypertension    Past Surgical History:  Procedure Laterality Date   APPENDECTOMY     COLONOSCOPY WITH PROPOFOL N/A 06/05/2018   Procedure: COLONOSCOPY WITH PROPOFOL;  Surgeon: Christena Deem, MD;  Location: [facility] ENDOSCOPY;  Service: Endoscopy;  Laterality: N/A;   ESOPHAGOGASTRODUODENOSCOPY (EGD) WITH PROPOFOL N/A 06/05/2018   Procedure: ESOPHAGOGASTRODUODENOSCOPY (EGD) WITH PROPOFOL;  Surgeon: Christena Deem, MD;  Location: [facility] ENDOSCOPY;  Service: Endoscopy;  Laterality: N/A;   EYE SURGERY     HERNIA REPAIR     KNEE CAPSULOTOMY Right    Patient Active Problem List   Diagnosis Date Noted   Fever of unknown origin (FUO), generalized weakness 02/21/2022   Weakness    Parkinson's disease 02/19/2022   Major depressive disorder, recurrent, mild 02/19/2022   Visual disturbance as complication of stroke 02/19/2022   PAD (peripheral artery disease) 01/11/2020   Tubular adenoma 12/30/2019   Aortic atherosclerosis 09/14/2019   Benign essential hypertension 09/14/2019   DM type 2 with diabetic mixed hyperlipidemia 09/14/2019   Medicare annual wellness visit, initial 09/14/2019   Panlobular  emphysema 09/14/2019   Hypertension 01/28/2017   Hyperlipidemia 01/28/2017   Bronchitis 01/28/2017   History of COPD 01/28/2017   Hx of retinal detachment 01/28/2017   Hx of diabetes mellitus 01/28/2017    ONSET DATE: 12/2021  REFERRING DIAG: Parkinson's  THERAPY DIAG:  Muscle weakness (generalized)  Other lack of coordination  Rationale for Evaluation and Treatment: Rehabilitation  SUBJECTIVE:   SUBJECTIVE STATEMENT:  Pt. Reports he has been working on his exercises at home, feels he is improving since starting therapy.  Pt. accompanied by: family member  PERTINENT HISTORY: Pt. Is a 77 y.o. male who was diagnosed with Parkinson's Disease in February of 2023. Pt. Was hospitalized with encephalopathy in 12/2021. Pt. PMHx includes: HTN, DM Type II, Macular Degeneration of the right eye, Stroke behind the left eye.   PRECAUTIONS: None  WEIGHT BEARING RESTRICTIONS: No  PAIN:  Are you having pain? No pain in the left shoulder   FALLS: Has patient fallen in last 6 months? No, one episode of a near fall into a wall.   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  OBJECTIVE:   HAND DOMINANCE: Right  ADLs: Overall ADLs: Independent Equipment: Grab bars  IADLs: Light housekeeping: Assists with taking out the trash,  dishes, vacuuming.  Meal Prep: Perfromed light meal prep including soup, sandwiches Community mobility: Drives, does not drive if the weather is bad Medication management: Pt.'s wife assists with Pillbox set-up. Pt. Is responsible for taking meds, sets phone alarm to alert medication time Financial management: No change in the process; Reports shared duties 50% Handwriting: Not legible  MOBILITY STATUS: Independent with Freezing/Hesitation  upon initiation  POSTURE COMMENTS:  rounded shoulders   FUNCTIONAL OUTCOME MEASURES: FOTO: 86  TUG: 9 sec.  5x's Sit to Stand: 13 sec.  6 min. Walk Test: 1270 ft.  Freezing of Gait: 7/20   Berg Balance Test: 50/56  UPPER EXTREMITY ROM:    Active ROM Right WFL Left [facility]  Shoulder flexion    Shoulder abduction    Shoulder adduction    Shoulder extension    Shoulder internal rotation    Shoulder external rotation    Elbow flexion    Elbow extension    Wrist flexion    Wrist extension    Wrist ulnar deviation    Wrist radial deviation    Wrist pronation    Wrist supination    (Blank rows = not tested)  UPPER EXTREMITY MMT:     MMT Right eval Left eval  Shoulder flexion 5/5 5/5  Shoulder abduction 5/5 5/5  Shoulder adduction    Shoulder extension    Shoulder internal rotation    Shoulder external rotation    Middle trapezius    Lower trapezius    Elbow flexion 5/5 5/5  Elbow extension 5/5 5/5  Wrist flexion    Wrist extension 5/5 5/5  Wrist ulnar deviation    Wrist radial deviation    Wrist pronation    Wrist supination    (Blank rows = not tested)  HAND FUNCTION: Grip strength: Right: 54 lbs; Left: 55 lbs, Lateral pinch: Right: 25 lbs, Left: 27 lbs, and 3 point pinch: Right: 20 lbs, Left: 18 lbs  COORDINATION: 9 Hole Peg test: Right: 36 sec; Left: 32 sec  SENSATION: Not tested  EDEMA: N/A  COGNITION: Overall cognitive status: Within functional limits for tasks assessed  VISION: Subjective report:  Baseline vision: Wears glasses Visual history: macular degeneration: right eye, History of stroke behind left eye  VISION ASSESSMENT: To be further assessed in functional context  PRAXIS: WFL   TODAY'S TREATMENT:                                                                                                                              DATE: 03/04/2023   LSVT: Patient seen for LSVT Daily Session Maximal Daily Exercises for  facilitation/coordination of movement Maximum Sustained Movements are designed to rescale the amplitude of movement output for generalization to daily functional activities. Performed as follows for 1 set of 10 repetitions each multi-directional sustained movements: 1) Floor to ceiling- Pt. continues to require fewer cues for movement patterns, hand position, and posture. Pt. performed the task with bilateral  hand flicks with emphasis placed on digit extension, and thumb abduction. 2) Side to side multidirectional- Pt. worked on alternating sides with every repetition. Pt. Performed the task with bilateral hand flicks  with cues for digit extension, and thumb abduction.  Repetitive movements performed in standing and are designed to provide retraining effort needed for sustained muscle activation in tasks. Performed as follows for 1 set of 10 repetitions each of multi-directional repetitive movements:  The movements were performed in the standard position, with a chair available.  3) Step and reach forward step-  No LOB while forward step, and reach. 4) Step and reach sideways step-  Pt. required  fewer cues to keep the BUEs symmetrical with no reports of left shoulder discomfort. 5) Step and reach backwards step- minimal cues for arm movements during exercise 6) Rock and reach forward/backward- Pt. Continues to require fewer cues initially for each side. Pt. presented with more upright posture     7) Rock and reach sideways- Pt. requred cues for UE position, and weightshift  during trunk rotation, elongation, and turning. Provided pt with targets to reach for improved carryover with UE motion during exercise.   Functional Component Task:  Sit to stand: Pt. is improving with amplitude of movements during sit to stand, 10 reps from mat table at lowest setting.   Posterior Arm swing: Trials in standing with reciprocal arm swing with emphasis on posterior motion to achieve greater amplitude of  movements.  Handwriting: Pt seen for formulation of list of 10 items, decreased legibility and decreased smoothness of movement when pen use.  Attempted to try weighted pen this date with no change and larger BIP pen however pt did not like and prefers regular sized pen.   BIG ambulation:   6 min walk test:  1245 feet, quality of movement patterns improved, reciprocal arm movements present.  5 times sit to stand: 11 secs TUG: 9 secs FOTO: 93   PATIENT EDUCATION: Education details: OT services POC, Goals Person educated: Patient and Spouse Education method: Explanation, Demonstration, Tactile cues, and Verbal cues Education comprehension: verbalized understanding and returned demonstration  HOME EXERCISE PROGRAM:  Continue to provide ongoing HEP training as needed.   GOALS: Goals reviewed with patient? Yes  SHORT TERM GOALS: Target date: 03/06/2023      Pt. Will demonstrate independence with HEPs  Baseline: Eval: No current HEP, 10th visit:  HEP with continual changes to increase complexity Goal status: IN PROGRESS   LONG TERM GOALS: Target date: 03/27/2023 *Refer to Functional Outcome Measure section above for additional objective data  1.  Pt. will increase FOTO score by 1 point to reflect Pt. perceived improvement with assessment specific ADL/IADL's.  Baseline: Eval: FOTO: 86, 10th visit: 93 Goal status: Progressing  2.  Pt. Will improve right hand [facility] skills by 2 sec. Of speed for improved control with manipulation of small objects. Baseline: Eval: R: 36 sec. L: 32 sec., 10th visit:  R: 34 secs L: 32 secs  Goal status: IN PROGRESS  3.  Pt. Will improve 6 min. walk test score by 100 ft. With minimal cues for improved amplitude of movements. Baseline: Eval: 1270 ft. In 6 min., 10th visit: 1245 feet  Goal status: IN PROGRESS  4.  Pt. Will demonstrate improved dynamic standing balance during daily ADL/IADL tasks.  Baseline: Eval: Berg Balance Test: 50/56, 10th visit:  52/56 Goal status: IN PROGRESS  5.  Pt. Will improve writing legibility to be able to print his first name.  Baseline: Eval: Not legible, 10th visit:  pt improving with letter size and legibility  Goal status: IN PROGRESS  ASSESSMENT:  CLINICAL IMPRESSION: Pt seen this date for progress update, goals updated to reflect current status.  Pt has continued to progress well in all areas.  He is becoming familiar with his maximal daily exercises, some difficulty recalling sequencing of exercises without use of handouts for reference.  Pt requires demonstration and cues for arm placement during most exercises this date but once cued, he is able to demonstrate consistently for the remainder of each exercise.  Improvements noted in TUG, BERG, FOTO and 5 times sit to stand  Pt continues to benefit from skilled OT services to maximize safety and independence in necessary daily tasks.    PERFORMANCE DEFICITS: in functional skills including ADLs, IADLs, dexterity, ROM, strength, Fine motor control, Gross motor control, balance, body mechanics, and endurance, cognitive skills including attention and safety awareness, and psychosocial skills including environmental adaptation and routines and behaviors.   IMPAIRMENTS: are limiting patient from ADLs, IADLs, work, and leisure.   CO-MORBIDITIES: may have co-morbidities  that affects occupational performance. Patient will benefit from skilled OT to address above impairments and improve overall function.  MODIFICATION OR ASSISTANCE TO COMPLETE EVALUATION: Maximum or significant modification of tasks or assist is necessary to complete an evaluation.  OT OCCUPATIONAL PROFILE AND HISTORY: Comprehensive assessment: Review of records and extensive additional review of physical, cognitive, psychosocial history related to current functional performance.  CLINICAL DECISION MAKING: High - multiple treatment options, significant modification of task necessary  REHAB  POTENTIAL: Good  EVALUATION COMPLEXITY: High    PLAN:  OT FREQUENCY:  17 visits  OT DURATION: 6 weeks  PLANNED INTERVENTIONS: self care/ADL training, therapeutic exercise, therapeutic activity, neuromuscular re-education, passive range of motion, balance training, and functional mobility training  RECOMMENDED OTHER SERVICES: N/A  CONSULTED AND AGREED WITH PLAN OF CARE: Patient and family member/caregiver  PLAN FOR NEXT SESSION:  Plan to complete the reassessment for the 10th visit Progress report  Aiken Withem T [place] Nase, OTR/L, CLT  03/09/2023, 1:40 PM

## 2023-03-09 NOTE — Therapy (Signed)
OUTPATIENT OCCUPATIONAL THERAPY NEURO TREATMENT Patient Name: Darren Sharp MRN: [id number] DOB:Dec 30, 1945, 77 y.o., male   PCP: Bethann Punches, MD REFERRING PROVIDER: Kerin Salen, MD  END OF SESSION:   OT End of Session - 03/09/23 1442     Visit Number 11    Number of Visits 17    Date for OT Re-Evaluation 03/27/23    OT Start Time 1300    OT Stop Time 1400    OT Time Calculation (min) 60 min    Activity Tolerance Patient tolerated treatment well    Behavior During Therapy WFL for tasks assessed/performed                       Past Medical History:  Diagnosis Date   COPD (chronic obstructive pulmonary disease)    COPD (chronic obstructive pulmonary disease)    Diabetes mellitus without complication    Hypertension    Past Surgical History:  Procedure Laterality Date   APPENDECTOMY     COLONOSCOPY WITH PROPOFOL N/A 06/05/2018   Procedure: COLONOSCOPY WITH PROPOFOL;  Surgeon: Christena Deem, MD;  Location: [facility] ENDOSCOPY;  Service: Endoscopy;  Laterality: N/A;   ESOPHAGOGASTRODUODENOSCOPY (EGD) WITH PROPOFOL N/A 06/05/2018   Procedure: ESOPHAGOGASTRODUODENOSCOPY (EGD) WITH PROPOFOL;  Surgeon: Christena Deem, MD;  Location: [facility] ENDOSCOPY;  Service: Endoscopy;  Laterality: N/A;   EYE SURGERY     HERNIA REPAIR     KNEE CAPSULOTOMY Right    Patient Active Problem List   Diagnosis Date Noted   Fever of unknown origin (FUO), generalized weakness 02/21/2022   Weakness    Parkinson's disease 02/19/2022   Major depressive disorder, recurrent, mild 02/19/2022   Visual disturbance as complication of stroke 02/19/2022   PAD (peripheral artery disease) 01/11/2020   Tubular adenoma 12/30/2019   Aortic atherosclerosis 09/14/2019   Benign essential hypertension 09/14/2019   DM type 2 with diabetic mixed hyperlipidemia 09/14/2019   Medicare annual wellness visit, initial 09/14/2019   Panlobular emphysema 09/14/2019   Hypertension 01/28/2017   Hyperlipidemia  01/28/2017   Bronchitis 01/28/2017   History of COPD 01/28/2017   Hx of retinal detachment 01/28/2017   Hx of diabetes mellitus 01/28/2017    ONSET DATE: 12/2021  REFERRING DIAG: Parkinson's  THERAPY DIAG:  Muscle weakness (generalized)  Other lack of coordination  Rationale for Evaluation and Treatment: Rehabilitation  SUBJECTIVE:   SUBJECTIVE STATEMENT:  Patient reports he is doing well with the exercises however he feels he cannot recall the sequencing of the exercise and still needs to refer to his printed home program often.  Pt reports since starting therapy, he is now able to use his computer mouse without difficult and with greater precision.     Pt. accompanied by: family member  PERTINENT HISTORY: Pt. Is a 77 y.o. male who was diagnosed with Parkinson's Disease in February of 2023. Pt. Was hospitalized with encephalopathy in 12/2021. Pt. PMHx includes: HTN, DM Type II, Macular Degeneration of the right eye, Stroke behind the left eye.   PRECAUTIONS: None  WEIGHT BEARING RESTRICTIONS: No  PAIN:  Are you having pain? No pain in the left shoulder   FALLS: Has patient fallen in last 6 months? No, one episode of a near fall into a wall.   LIVING ENVIRONMENT: Lives with: lives with their spouse Lives in: House/apartment One story Stairs: 4 steps to enter Has following equipment at home: Grab bars  PLOF: Independent  PATIENT GOALS: To  go to [place], and resume  selling [employer].  OBJECTIVE:   HAND DOMINANCE: Right  ADLs: Overall ADLs: Independent Equipment: Grab bars  IADLs: Light housekeeping: Assists with taking out the trash, dishes, vacuuming.  Meal Prep: Perfromed light meal prep including soup, sandwiches Community mobility: Drives, does not drive if the weather is bad Medication management: Pt.'s wife assists with Pillbox set-up. Pt. Is responsible for taking meds, sets phone alarm to alert medication time Financial management:  No change in the process; Reports shared duties 50% Handwriting: Not legible  MOBILITY STATUS: Independent with Freezing/Hesitation upon initiation  POSTURE COMMENTS:  rounded shoulders   FUNCTIONAL OUTCOME MEASURES: FOTO: 86 , 10th visit: 93  TUG: 9 sec. 10th visit:  9 sec  5x's Sit to Stand: 13 sec., 10th visit:  11 sec  6 min. Walk Test: 1270 ft., 10th visit:  1245 feet  Freezing of Gait: 7/20 , 10th visit:  denies incidences of hesitation or freezing of gait.  Berg Balance Test: 50/56, 10th visit: 52/56  UPPER EXTREMITY ROM:    Active ROM Right WFL Left [facility]  Shoulder flexion    Shoulder abduction    Shoulder adduction    Shoulder extension    Shoulder internal rotation    Shoulder external rotation    Elbow flexion    Elbow extension    Wrist flexion    Wrist extension    Wrist ulnar deviation    Wrist radial deviation    Wrist pronation    Wrist supination    (Blank rows = not tested)  UPPER EXTREMITY MMT:     MMT Right eval Left eval  Shoulder flexion 5/5 5/5  Shoulder abduction 5/5 5/5  Shoulder adduction    Shoulder extension    Shoulder internal rotation    Shoulder external rotation    Middle trapezius    Lower trapezius    Elbow flexion 5/5 5/5  Elbow extension 5/5 5/5  Wrist flexion    Wrist extension 5/5 5/5  Wrist ulnar deviation    Wrist radial deviation    Wrist pronation    Wrist supination    (Blank rows = not tested)  HAND FUNCTION: Grip strength: Right: 54 lbs; Left: 55 lbs, Lateral pinch: Right: 25 lbs, Left: 27 lbs, and 3 point pinch: Right: 20 lbs, Left: 18 lbs  COORDINATION: 9 Hole Peg test: Right: 36 sec; Left: 32 sec  SENSATION: Not tested  EDEMA: N/A  COGNITION: Overall cognitive status: Within functional limits for tasks assessed  VISION: Subjective report:  Baseline vision: Wears glasses Visual history: macular degeneration: right eye, History of stroke behind left eye  VISION ASSESSMENT: To be further  assessed in functional context  PRAXIS: WFL   TODAY'S TREATMENT:                                                                                                                              DATE: 03/05/2023   LSVT: Patient seen for LSVT Daily  Session Maximal Daily Exercises for facilitation/coordination of movement Maximum Sustained Movements are designed to rescale the amplitude of movement output for generalization to daily functional activities. Performed as follows for 1 set of 10 repetitions each multi-directional sustained movements: 1) Floor to ceiling- Pt. performed the task with bilateral hand flicks with emphasis placed on digit extension, and thumb abduction cues to fully open digits between reps. 2) Side to side multidirectional- Pt demonstrating alternating sides with every repetition. Pt. Performed the task with bilateral hand flicks  with cues for digit extension, and thumb abduction.  Repetitive movements performed in standing and are designed to provide retraining effort needed for sustained muscle activation in tasks. Performed as follows for 1 set of 10 repetitions each of multi-directional repetitive movements:  The movements were performed in the standard position, with a chair available.  3) Step and reach forward step-  No LOB while forward step, and reach.  Cues for arm positioning 4) Step and reach sideways step-  Pt. required  fewer cues to keep the BUEs symmetrical and following BIG principles 5) Step and reach backwards step- minimal cues for arm movements during exercise 6) Rock and reach forward/backward- Pt. Continues to require fewer cues initially for each side. Pt. presented with more upright posture     7) Rock and reach sideways- Pt. required cues for UE position, and weightshift during trunk rotation, elongation, and turning. Provided pt with targets to reach for improved carryover with UE motion during exercise.   7  hand exercises to help promote  handwriting, dexterity and flexibility.  1)  Fisting with arm then arm extension with fingers extended with BIG hand movements 2) oppositional movements of the thumb to each digit 3) wrist flex/ext with elbows extended 4) supination/pronation 5) tendon gliding with hook fist and then finger extension 6) tendon gliding with tabletop movement of fingers with MP flexion, PIP and DIP extension 7) MP flexion, PIP flexion, DIP extension Performed with therapist demo and cues for form and technique.  Added LE ROM exercises as follows:   Bilateral leg extensions, seated marching, ankle pumps, ankle circles for 10 reps for 1 set  Functional Component Tasks: Sit to stand: Pt. is improving with amplitude of movements during sit to stand, 10 reps from mat table at lowest setting.   Posterior Arm swing: Trials in standing with reciprocal arm swing with emphasis on posterior motion to achieve greater amplitude of movements.  Handwriting: Pt seen for formulation of list of 10 items, decreased legibility and decreased smoothness of movement when pen use.    BIG ambulation:   Functional mobility for 1200 feet with occasional cues for increased reciprocal arm swing and for amplitude of steps.   PATIENT EDUCATION: Education details: OT services POC, Goals Person educated: Patient and Spouse Education method: Explanation, Demonstration, Tactile cues, and Verbal cues Education comprehension: verbalized understanding and returned demonstration  HOME EXERCISE PROGRAM:  Continue to provide ongoing HEP training as needed.   GOALS: Goals reviewed with patient? Yes  SHORT TERM GOALS: Target date: 03/06/2023      Pt. Will demonstrate independence with HEPs  Baseline: Eval: No current HEP, 10th visit:  HEP with continual changes to increase complexity Goal status: IN PROGRESS   LONG TERM GOALS: Target date: 03/27/2023 *Refer to Functional Outcome Measure section above for additional objective  data  1.  Pt. will increase FOTO score by 1 point to reflect Pt. perceived improvement with assessment specific ADL/IADL's.  Baseline: Eval: FOTO: 86, 10th visit:  93 Goal status: Progressing  2.  Pt. Will improve right hand [facility] skills by 2 sec. Of speed for improved control with manipulation of small objects. Baseline: Eval: R: 36 sec. L: 32 sec., 10th visit:  R: 34 secs L: 32 secs  Goal status: IN PROGRESS  3.  Pt. Will improve 6 min. walk test score by 100 ft. With minimal cues for improved amplitude of movements. Baseline: Eval: 1270 ft. In 6 min., 10th visit: 1245 feet  Goal status: IN PROGRESS  4.  Pt. Will demonstrate improved dynamic standing balance during daily ADL/IADL tasks.  Baseline: Eval: Berg Balance Test: 50/56, 10th visit: 52/56 Goal status: IN PROGRESS  5.  Pt. Will improve writing legibility to be able to print his first name. Baseline: Eval: Not legible, 10th visit:  pt improving with letter size and legibility  Goal status: IN PROGRESS  ASSESSMENT:  CLINICAL IMPRESSION: Patient has incorporated hand flicks into his routine, cues for opening hands more in repetition as well as alternating sides. Occasional cues for posture during exercises will work towards facilitating recall of exercises this week without the use of his handout. Pt with improved handwriting this date with increased letter size formation and legibility.  Tends to have more difficulty with first word of the list but handwriting improves as he continues to write more.  Pt able to demonstrate understanding of added UE/LE exercises to help facilitate increased motion of LE prior to ambulating.  Continue to work towards goals to maximize safety and independence and necessary daily tasks at home and community.   PERFORMANCE DEFICITS: in functional skills including ADLs, IADLs, dexterity, ROM, strength, Fine motor control, Gross motor control, balance, body mechanics, and endurance, cognitive skills including  attention and safety awareness, and psychosocial skills including environmental adaptation and routines and behaviors.   IMPAIRMENTS: are limiting patient from ADLs, IADLs, work, and leisure.   CO-MORBIDITIES: may have co-morbidities  that affects occupational performance. Patient will benefit from skilled OT to address above impairments and improve overall function.  MODIFICATION OR ASSISTANCE TO COMPLETE EVALUATION: Maximum or significant modification of tasks or assist is necessary to complete an evaluation.  OT OCCUPATIONAL PROFILE AND HISTORY: Comprehensive assessment: Review of records and extensive additional review of physical, cognitive, psychosocial history related to current functional performance.  CLINICAL DECISION MAKING: High - multiple treatment options, significant modification of task necessary  REHAB POTENTIAL: Good  EVALUATION COMPLEXITY: High    PLAN:  OT FREQUENCY:  17 visits  OT DURATION: 6 weeks  PLANNED INTERVENTIONS: self care/ADL training, therapeutic exercise, therapeutic activity, neuromuscular re-education, passive range of motion, balance training, and functional mobility training  RECOMMENDED OTHER SERVICES: N/A  CONSULTED AND AGREED WITH PLAN OF CARE: Patient and family member/caregiver  PLAN FOR NEXT SESSION:  Plan to complete the reassessment for the 10th visit Progress report  Kerrie Buffalo, OTR/L, CLT  03/09/2023, 2:56 PM

## 2023-03-09 NOTE — Therapy (Signed)
OUTPATIENT OCCUPATIONAL THERAPY NEURO TREATMENT Patient Name: Darren Sharp MRN: [id number] DOB:05/09/1946, 77 y.o., male   PCP: Bethann Punches, MD REFERRING PROVIDER: Kerin Salen, MD  END OF SESSION:   OT End of Session - 03/09/23 2203     Visit Number 13    Number of Visits 17    Date for OT Re-Evaluation 03/27/23    OT Start Time 1300    OT Stop Time 1400    OT Time Calculation (min) 60 min    Activity Tolerance Patient tolerated treatment well    Behavior During Therapy WFL for tasks assessed/performed                       Past Medical History:  Diagnosis Date   COPD (chronic obstructive pulmonary disease)    COPD (chronic obstructive pulmonary disease)    Diabetes mellitus without complication    Hypertension    Past Surgical History:  Procedure Laterality Date   APPENDECTOMY     COLONOSCOPY WITH PROPOFOL N/A 06/05/2018   Procedure: COLONOSCOPY WITH PROPOFOL;  Surgeon: Christena Deem, MD;  Location: [facility] ENDOSCOPY;  Service: Endoscopy;  Laterality: N/A;   ESOPHAGOGASTRODUODENOSCOPY (EGD) WITH PROPOFOL N/A 06/05/2018   Procedure: ESOPHAGOGASTRODUODENOSCOPY (EGD) WITH PROPOFOL;  Surgeon: Christena Deem, MD;  Location: [facility] ENDOSCOPY;  Service: Endoscopy;  Laterality: N/A;   EYE SURGERY     HERNIA REPAIR     KNEE CAPSULOTOMY Right    Patient Active Problem List   Diagnosis Date Noted   Fever of unknown origin (FUO), generalized weakness 02/21/2022   Weakness    Parkinson's disease 02/19/2022   Major depressive disorder, recurrent, mild 02/19/2022   Visual disturbance as complication of stroke 02/19/2022   PAD (peripheral artery disease) 01/11/2020   Tubular adenoma 12/30/2019   Aortic atherosclerosis 09/14/2019   Benign essential hypertension 09/14/2019   DM type 2 with diabetic mixed hyperlipidemia 09/14/2019   Medicare annual wellness visit, initial 09/14/2019   Panlobular emphysema 09/14/2019   Hypertension 01/28/2017   Hyperlipidemia  01/28/2017   Bronchitis 01/28/2017   History of COPD 01/28/2017   Hx of retinal detachment 01/28/2017   Hx of diabetes mellitus 01/28/2017    ONSET DATE: 12/2021  REFERRING DIAG: Parkinson's  THERAPY DIAG:  Muscle weakness (generalized)  Other lack of coordination  Rationale for Evaluation and Treatment: Rehabilitation  SUBJECTIVE:   SUBJECTIVE STATEMENT:  Pt reports he is doing well, has been enjoying the nice weather this week and has been working on his exercises at home as well as walking daily and in the community.  Pt. accompanied by: family member  PERTINENT HISTORY: Pt. Is a 77 y.o. male who was diagnosed with Parkinson's Disease in February of 2023. Pt. Was hospitalized with encephalopathy in 12/2021. Pt. PMHx includes: HTN, DM Type II, Macular Degeneration of the right eye, Stroke behind the left eye.   PRECAUTIONS: None  WEIGHT BEARING RESTRICTIONS: No  PAIN:  Are you having pain? No pain in the left shoulder   FALLS: Has patient fallen in last 6 months? No, one episode of a near fall into a wall.   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  OBJECTIVE:   HAND DOMINANCE: Right  ADLs: Overall ADLs: Independent Equipment: Grab bars  IADLs: Light housekeeping:  Assists with taking out the trash, dishes, vacuuming.  Meal Prep: Perfromed light meal prep including soup, sandwiches Community mobility: Drives, does not drive if the weather is bad Medication management: Pt.'s wife assists with Pillbox set-up. Pt. Is responsible for taking meds, sets phone alarm to alert medication time Financial management: No change in the process; Reports shared duties 50% Handwriting: Not legible  MOBILITY STATUS: Independent with Freezing/Hesitation  upon initiation  POSTURE COMMENTS:  rounded shoulders   FUNCTIONAL OUTCOME MEASURES: FOTO: 86 , 10th visit: 93  TUG: 9 sec. 10th visit:  9 sec  5x's Sit to Stand: 13 sec., 10th visit:  11 sec  6 min. Walk Test: 1270 ft., 10th visit:  1245 feet  Freezing of Gait: 7/20 , 10th visit:  denies incidences of hesitation or freezing of gait.  Berg Balance Test: 50/56, 10th visit: 52/56  UPPER EXTREMITY ROM:    Active ROM Right WFL Left [facility]  Shoulder flexion    Shoulder abduction    Shoulder adduction    Shoulder extension    Shoulder internal rotation    Shoulder external rotation    Elbow flexion    Elbow extension    Wrist flexion    Wrist extension    Wrist ulnar deviation    Wrist radial deviation    Wrist pronation    Wrist supination    (Blank rows = not tested)  UPPER EXTREMITY MMT:     MMT Right eval Left eval  Shoulder flexion 5/5 5/5  Shoulder abduction 5/5 5/5  Shoulder adduction    Shoulder extension    Shoulder internal rotation    Shoulder external rotation    Middle trapezius    Lower trapezius    Elbow flexion 5/5 5/5  Elbow extension 5/5 5/5  Wrist flexion    Wrist extension 5/5 5/5  Wrist ulnar deviation    Wrist radial deviation    Wrist pronation    Wrist supination    (Blank rows = not tested)  HAND FUNCTION: Grip strength: Right: 54 lbs; Left: 55 lbs, Lateral pinch: Right: 25 lbs, Left: 27 lbs, and 3 point pinch: Right: 20 lbs, Left: 18 lbs  COORDINATION: 9 Hole Peg test: Right: 36 sec; Left: 32 sec  SENSATION: Not tested  EDEMA: N/A  COGNITION: Overall cognitive status: Within functional limits for tasks assessed  VISION: Subjective report:  Baseline vision: Wears glasses Visual history: macular degeneration: right eye, History of stroke behind left eye  VISION ASSESSMENT: To be further assessed in functional context  PRAXIS: WFL   TODAY'S TREATMENT:                                                                                                                               DATE: 03/07/2023   LSVT: Patient seen for LSVT Daily Session Maximal Daily Exercises for facilitation/coordination of movement Maximum Sustained Movements are designed to rescale the amplitude of movement output for generalization to  daily functional activities. Performed as follows for 1 set of 10 repetitions each multi-directional sustained movements: 1) Floor to ceiling- Pt. performed the task with bilateral hand flicks with emphasis placed on digit extension, and thumb abduction cues to fully open digits between reps. 2) Side to side multidirectional- Pt demonstrating alternating sides with every repetition. Pt. Performed the task with bilateral hand flicks  with cues for digit extension, and thumb abduction.  Repetitive movements performed in standing and are designed to provide retraining effort needed for sustained muscle activation in tasks. Performed as follows for 1 set of 10 repetitions each of multi-directional repetitive movements:  The movements were performed in the standard position, with a chair available.  3) Step and reach forward step-  No LOB while forward step, and reach.  Cues for arm positioning to place arms wide 4) Step and reach sideways step-  Pt. required  fewer cues to keep the BUEs symmetrical and following BIG principles 5) Step and reach backwards step- demonstrating increased ROM for reaching back 6) Rock and reach forward/backward- Pt. Continues to require fewer cues initially for each side. 7) Rock and reach sideways- Pt. required cues for UE position, and weightshift during trunk rotation, elongation, and turning. Provided pt with targets to reach for improved carryover with UE motion during exercise.   7  hand exercises to help promote handwriting, dexterity and flexibility.  1)  Fisting with arm then arm extension with fingers extended with BIG hand movements 2) oppositional movements of the thumb to each  digit 3) wrist flex/ext with elbows extended 4) supination/pronation 5) tendon gliding with hook fist and then finger extension 6) tendon gliding with tabletop movement of fingers with MP flexion, PIP and DIP extension 7) MP flexion, PIP flexion, DIP extension Performed with therapist demo and cues for form and technique.  LE ROM exercises as follows:   Bilateral leg extensions, seated marching, ankle pumps, ankle circles for 10 reps for 1 set  Functional Component Tasks: Sit to stand: Pt. is improving with amplitude of movements during sit to stand, 10 reps from mat table at lowest setting.   Posterior Arm swing: Trials in standing with reciprocal arm swing with emphasis on posterior motion to achieve greater amplitude of movements.  Handwriting: Pt seen for formulation of list of 10 items, decreased legibility and decreased smoothness of movement when pen use.   Balance with use of BOSU ball , standing on flat side at the table and engaging in multi directional reaching patterns, therapist providing min guard to min assist for balance.   BIG ambulation:   Functional mobility for 1050 feet with occasional cues for increased reciprocal arm swing and for amplitude of steps.   PATIENT EDUCATION: Education details: OT services POC, Goals Person educated: Patient and Spouse Education method: Explanation, Demonstration, Tactile cues, and Verbal cues Education comprehension: verbalized understanding and returned demonstration  HOME EXERCISE PROGRAM:  Continue to provide ongoing HEP training as needed.   GOALS: Goals reviewed with patient? Yes  SHORT TERM GOALS: Target date: 03/06/2023      Pt. Will demonstrate independence with HEPs  Baseline: Eval: No current HEP, 10th visit:  HEP with continual changes to increase complexity Goal status: IN PROGRESS   LONG TERM GOALS: Target date: 03/27/2023 *Refer to Functional Outcome Measure section above for additional objective data  1.   Pt. will increase FOTO score by 1 point to reflect Pt. perceived improvement with assessment specific ADL/IADL's.  Baseline: Eval: FOTO: 86, 10th visit:  93 Goal status: Progressing  2.  Pt. Will improve right hand [facility] skills by 2 sec. Of speed for improved control with manipulation of small objects. Baseline: Eval: R: 36 sec. L: 32 sec., 10th visit:  R: 34 secs L: 32 secs  Goal status: IN PROGRESS  3.  Pt. Will improve 6 min. walk test score by 100 ft. With minimal cues for improved amplitude of movements. Baseline: Eval: 1270 ft. In 6 min., 10th visit: 1245 feet  Goal status: IN PROGRESS  4.  Pt. Will demonstrate improved dynamic standing balance during daily ADL/IADL tasks.  Baseline: Eval: Berg Balance Test: 50/56, 10th visit: 52/56 Goal status: IN PROGRESS  5.  Pt. Will improve writing legibility to be able to print his first name. Baseline: Eval: Not legible, 10th visit:  pt improving with letter size and legibility  Goal status: IN PROGRESS  ASSESSMENT:  CLINICAL IMPRESSION: Patient Continues to progress with sequencing of his maximal daily exercises with less dependence on written home exercise program. Added additional leg exercises this week to facilitate range of motion, and priming of lower extremities prior to functional mobility skills, seated marching and marching to increase amplitude of stepping patterns. Performing increased, challenged activities with use of Bosu, therapist providing minimal assistance to min guard for safety activity. Patient demonstrates significant improvements with his handwriting this week with formulating lists of items with improved legibility and size of written strokes wife reports she can tell a big difference. Patient is pleased with his progress. 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  PERFORMANCE DEFICITS: in functional skills including ADLs, IADLs, dexterity, ROM, strength,  Fine motor control, Gross motor control, balance, body mechanics, and endurance, cognitive skills including attention and safety awareness, and psychosocial skills including environmental adaptation and routines and behaviors.   IMPAIRMENTS: are limiting patient from ADLs, IADLs, work, and leisure.   CO-MORBIDITIES: may have co-morbidities  that affects occupational performance. Patient will benefit from skilled OT to address above impairments and improve overall function.  MODIFICATION OR ASSISTANCE TO COMPLETE EVALUATION: Maximum or significant modification of tasks or assist is necessary to complete an evaluation.  OT OCCUPATIONAL PROFILE AND HISTORY: Comprehensive assessment: Review of records and extensive additional review of physical, cognitive, psychosocial history related to current functional performance.  CLINICAL DECISION MAKING: High - multiple treatment options, significant modification of task necessary  REHAB POTENTIAL: Good  EVALUATION COMPLEXITY: High    PLAN:  OT FREQUENCY:  17 visits  OT DURATION: 6 weeks  PLANNED INTERVENTIONS: self care/ADL training, therapeutic exercise, therapeutic activity, neuromuscular re-education, passive range of motion, balance training, and functional mobility training  RECOMMENDED OTHER SERVICES: N/A  CONSULTED AND AGREED WITH PLAN OF CARE: Patient and family member/caregiver  PLAN FOR NEXT SESSION:     Kerrie Buffalo, OTR/L, CLT  03/09/2023, 10:04 PM

## 2023-03-09 NOTE — Therapy (Signed)
OUTPATIENT OCCUPATIONAL THERAPY NEURO TREATMENT Patient Name: Darren Sharp MRN: [id number] DOB:[birth year], 77 y.o., male   PCP: Bethann Punches, MD REFERRING PROVIDER: Kerin Salen, MD  END OF SESSION:   OT End of Session - 03/09/23 1537     Visit Number 12    Number of Visits 17    Date for OT Re-Evaluation 03/27/23    OT Start Time 1301    OT Stop Time 1400    OT Time Calculation (min) 59 min    Activity Tolerance Patient tolerated treatment well    Behavior During Therapy WFL for tasks assessed/performed                       Past Medical History:  Diagnosis Date   COPD (chronic obstructive pulmonary disease)    COPD (chronic obstructive pulmonary disease)    Diabetes mellitus without complication    Hypertension    Past Surgical History:  Procedure Laterality Date   APPENDECTOMY     COLONOSCOPY WITH PROPOFOL N/A 06/05/2018   Procedure: COLONOSCOPY WITH PROPOFOL;  Surgeon: Christena Deem, MD;  Location: [facility] ENDOSCOPY;  Service: Endoscopy;  Laterality: N/A;   ESOPHAGOGASTRODUODENOSCOPY (EGD) WITH PROPOFOL N/A 06/05/2018   Procedure: ESOPHAGOGASTRODUODENOSCOPY (EGD) WITH PROPOFOL;  Surgeon: Christena Deem, MD;  Location: [facility] ENDOSCOPY;  Service: Endoscopy;  Laterality: N/A;   EYE SURGERY     HERNIA REPAIR     KNEE CAPSULOTOMY Right    Patient Active Problem List   Diagnosis Date Noted   Fever of unknown origin (FUO), generalized weakness 02/21/2022   Weakness    Parkinson's disease 02/19/2022   Major depressive disorder, recurrent, mild 02/19/2022   Visual disturbance as complication of stroke 02/19/2022   PAD (peripheral artery disease) 01/11/2020   Tubular adenoma 12/30/2019   Aortic atherosclerosis 09/14/2019   Benign essential hypertension 09/14/2019   DM type 2 with diabetic mixed hyperlipidemia 09/14/2019   Medicare annual wellness visit, initial 09/14/2019   Panlobular emphysema 09/14/2019   Hypertension 01/28/2017   Hyperlipidemia  01/28/2017   Bronchitis 01/28/2017   History of COPD 01/28/2017   Hx of retinal detachment 01/28/2017   Hx of diabetes mellitus 01/28/2017    ONSET DATE: 12/2021  REFERRING DIAG: Parkinson's  THERAPY DIAG:  Muscle weakness (generalized)  Other lack of coordination  Rationale for Evaluation and Treatment: Rehabilitation  SUBJECTIVE:   SUBJECTIVE STATEMENT:  Pt reports he is doing well, looking forward to going to [place] soon.  Wife wants to go shopping to look for things for their new apartment.  Pt. accompanied by: family member  PERTINENT HISTORY: Pt. Is a 77 y.o. male who was diagnosed with Parkinson's Disease in February of 2023. Pt. Was hospitalized with encephalopathy in 12/2021. Pt. PMHx includes: HTN, DM Type II, Macular Degeneration of the right eye, Stroke behind the left eye.   PRECAUTIONS: None  WEIGHT BEARING RESTRICTIONS: No  PAIN:  Are you having pain? No pain in the left shoulder   FALLS: Has patient fallen in last 6 months? No, one episode of a near fall into a wall.   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  OBJECTIVE:   HAND DOMINANCE: Right  ADLs: Overall ADLs: Independent Equipment: Grab bars  IADLs: Light housekeeping: Assists with taking out the  trash, dishes, vacuuming.  Meal Prep: Perfromed light meal prep including soup, sandwiches Community mobility: Drives, does not drive if the weather is bad Medication management: Pt.'s wife assists with Pillbox set-up. Pt. Is responsible for taking meds, sets phone alarm to alert medication time Financial management: No change in the process; Reports shared duties 50% Handwriting: Not legible  MOBILITY STATUS: Independent with Freezing/Hesitation upon initiation  POSTURE  COMMENTS:  rounded shoulders   FUNCTIONAL OUTCOME MEASURES: FOTO: 86 , 10th visit: 93  TUG: 9 sec. 10th visit:  9 sec  5x's Sit to Stand: 13 sec., 10th visit:  11 sec  6 min. Walk Test: 1270 ft., 10th visit:  1245 feet  Freezing of Gait: 7/20 , 10th visit:  denies incidences of hesitation or freezing of gait.  Berg Balance Test: 50/56, 10th visit: 52/56  UPPER EXTREMITY ROM:    Active ROM Right WFL Left [facility]  Shoulder flexion    Shoulder abduction    Shoulder adduction    Shoulder extension    Shoulder internal rotation    Shoulder external rotation    Elbow flexion    Elbow extension    Wrist flexion    Wrist extension    Wrist ulnar deviation    Wrist radial deviation    Wrist pronation    Wrist supination    (Blank rows = not tested)  UPPER EXTREMITY MMT:     MMT Right eval Left eval  Shoulder flexion 5/5 5/5  Shoulder abduction 5/5 5/5  Shoulder adduction    Shoulder extension    Shoulder internal rotation    Shoulder external rotation    Middle trapezius    Lower trapezius    Elbow flexion 5/5 5/5  Elbow extension 5/5 5/5  Wrist flexion    Wrist extension 5/5 5/5  Wrist ulnar deviation    Wrist radial deviation    Wrist pronation    Wrist supination    (Blank rows = not tested)  HAND FUNCTION: Grip strength: Right: 54 lbs; Left: 55 lbs, Lateral pinch: Right: 25 lbs, Left: 27 lbs, and 3 point pinch: Right: 20 lbs, Left: 18 lbs  COORDINATION: 9 Hole Peg test: Right: 36 sec; Left: 32 sec  SENSATION: Not tested  EDEMA: N/A  COGNITION: Overall cognitive status: Within functional limits for tasks assessed  VISION: Subjective report:  Baseline vision: Wears glasses Visual history: macular degeneration: right eye, History of stroke behind left eye  VISION ASSESSMENT: To be further assessed in functional context  PRAXIS: WFL   TODAY'S TREATMENT:                                                                                                                               DATE: 03/06/2023   LSVT: Patient seen for LSVT Daily Session Maximal Daily Exercises for facilitation/coordination of movement Maximum Sustained Movements are designed to rescale the amplitude of movement output for generalization to daily functional activities. Performed as  follows for 1 set of 10 repetitions each multi-directional sustained movements: 1) Floor to ceiling- Pt. performed the task with bilateral hand flicks with emphasis placed on digit extension, and thumb abduction cues to fully open digits between reps. 2) Side to side multidirectional- Pt demonstrating alternating sides with every repetition. Pt. Performed the task with bilateral hand flicks  with cues for digit extension, and thumb abduction.  Repetitive movements performed in standing and are designed to provide retraining effort needed for sustained muscle activation in tasks. Performed as follows for 1 set of 10 repetitions each of multi-directional repetitive movements:  The movements were performed in the standard position, with a chair available.  3) Step and reach forward step-  No LOB while forward step, and reach.  Cues for arm positioning 4) Step and reach sideways step-  Pt. required  fewer cues to keep the BUEs symmetrical and following BIG principles 5) Step and reach backwards step- decreased cues for arm movements during exercise 6) Rock and reach forward/backward-  Pt. presented with more upright posture     7) Rock and reach sideways- Pt. required cues for UE position, and weightshift during trunk rotation, elongation, and turning. Provided pt with targets to reach for improved carryover with UE motion during exercise.   7  hand exercises to help promote handwriting, dexterity and flexibility.  1)  Fisting with arm then arm extension with fingers extended with BIG hand movements 2) oppositional movements of the thumb to each digit 3) wrist flex/ext with elbows extended 4)  supination/pronation 5) tendon gliding with hook fist and then finger extension 6) tendon gliding with tabletop movement of fingers with MP flexion, PIP and DIP extension 7) MP flexion, PIP flexion, DIP extension Performed with therapist demo and occasional cues for form and technique.  Pt able to recall many of the exercises.  LE ROM exercises as follows:   Bilateral leg extensions, seated marching, ankle pumps, ankle circles for 10 reps for 1 set  Functional Component Tasks: Sit to stand: Pt. is improving with amplitude of movements during sit to stand, 10 reps from mat table at lowest setting.   Posterior Arm swing: Trials in standing with reciprocal arm swing with emphasis on posterior motion to achieve greater amplitude of movements.  Handwriting: Pt seen for formulation of list of 10 items, decreased legibility and decreased smoothness of movement when pen use.    BIG ambulation:   Functional mobility for 950 feet with occasional cues for increased reciprocal arm swing and for amplitude of steps.   PATIENT EDUCATION: Education details: OT services POC, Goals Person educated: Patient and Spouse Education method: Explanation, Demonstration, Tactile cues, and Verbal cues Education comprehension: verbalized understanding and returned demonstration  HOME EXERCISE PROGRAM:  Continue to provide ongoing HEP training as needed.   GOALS: Goals reviewed with patient? Yes  SHORT TERM GOALS: Target date: 03/06/2023      Pt. Will demonstrate independence with HEPs  Baseline: Eval: No current HEP, 10th visit:  HEP with continual changes to increase complexity Goal status: IN PROGRESS   LONG TERM GOALS: Target date: 03/27/2023 *Refer to Functional Outcome Measure section above for additional objective data  1.  Pt. will increase FOTO score by 1 point to reflect Pt. perceived improvement with assessment specific ADL/IADL's.  Baseline: Eval: FOTO: 86, 10th visit: 93 Goal status:  Progressing  2.  Pt. Will improve right hand [facility] skills by 2 sec. Of speed for improved control with manipulation of small objects. Baseline:  Eval: R: 36 sec. L: 32 sec., 10th visit:  R: 34 secs L: 32 secs  Goal status: IN PROGRESS  3.  Pt. Will improve 6 min. walk test score by 100 ft. With minimal cues for improved amplitude of movements. Baseline: Eval: 1270 ft. In 6 min., 10th visit: 1245 feet  Goal status: IN PROGRESS  4.  Pt. Will demonstrate improved dynamic standing balance during daily ADL/IADL tasks.  Baseline: Eval: Berg Balance Test: 50/56, 10th visit: 52/56 Goal status: IN PROGRESS  5.  Pt. Will improve writing legibility to be able to print his first name. Baseline: Eval: Not legible, 10th visit:  pt improving with letter size and legibility  Goal status: IN PROGRESS  ASSESSMENT:  CLINICAL IMPRESSION: Patient progressing well with maximal daily exercises, requires occasional cues, especially for arm placement for specific exercises and sequencing exercises as outlined in his home program. Added additional hand exercises this day for emphasis on range of motion and coordination.  Patient demonstrates some mild difficulty with dual tasking during performance of exercises and functional mobility skills. Continue to work towards improving amplitude of stepping patterns, functional gait and balance during daily activities. 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  PERFORMANCE DEFICITS: in functional skills including ADLs, IADLs, dexterity, ROM, strength, Fine motor control, Gross motor control, balance, body mechanics, and endurance, cognitive skills including attention and safety awareness, and psychosocial skills including environmental adaptation and routines and behaviors.   IMPAIRMENTS: are limiting patient from ADLs, IADLs, work, and leisure.   CO-MORBIDITIES: may have co-morbidities   that affects occupational performance. Patient will benefit from skilled OT to address above impairments and improve overall function.  MODIFICATION OR ASSISTANCE TO COMPLETE EVALUATION: Maximum or significant modification of tasks or assist is necessary to complete an evaluation.  OT OCCUPATIONAL PROFILE AND HISTORY: Comprehensive assessment: Review of records and extensive additional review of physical, cognitive, psychosocial history related to current functional performance.  CLINICAL DECISION MAKING: High - multiple treatment options, significant modification of task necessary  REHAB POTENTIAL: Good  EVALUATION COMPLEXITY: High    PLAN:  OT FREQUENCY:  17 visits  OT DURATION: 6 weeks  PLANNED INTERVENTIONS: self care/ADL training, therapeutic exercise, therapeutic activity, neuromuscular re-education, passive range of motion, balance training, and functional mobility training  RECOMMENDED OTHER SERVICES: N/A  CONSULTED AND AGREED WITH PLAN OF CARE: Patient and family member/caregiver  PLAN FOR NEXT SESSION:  Plan to complete the reassessment for the 10th visit Progress report  Kerrie Buffalo, OTR/L, CLT  03/09/2023, 3:37 PM

## 2023-03-11 ENCOUNTER — Ambulatory Visit: Payer: PPO | Admitting: Occupational Therapy

## 2023-03-11 DIAGNOSIS — M6281 Muscle weakness (generalized): Secondary | ICD-10-CM | POA: Diagnosis not present

## 2023-03-11 NOTE — Therapy (Signed)
OUTPATIENT OCCUPATIONAL THERAPY NEURO TREATMENT Patient Name: Darren Sharp MRN: [id number] DOB:1945/12/20, 77 y.o., male   PCP: Bethann Punches, MD REFERRING PROVIDER: Kerin Salen, MD  END OF SESSION:   OT End of Session - 03/11/23 2207     Visit Number 14    Number of Visits 17    Date for OT Re-Evaluation 03/27/23    OT Start Time 1300    OT Stop Time 1357    OT Time Calculation (min) 57 min    Activity Tolerance Patient tolerated treatment well    Behavior During Therapy WFL for tasks assessed/performed                       Past Medical History:  Diagnosis Date   COPD (chronic obstructive pulmonary disease)    COPD (chronic obstructive pulmonary disease)    Diabetes mellitus without complication    Hypertension    Past Surgical History:  Procedure Laterality Date   APPENDECTOMY     COLONOSCOPY WITH PROPOFOL N/A 06/05/2018   Procedure: COLONOSCOPY WITH PROPOFOL;  Surgeon: Christena Deem, MD;  Location: [facility] ENDOSCOPY;  Service: Endoscopy;  Laterality: N/A;   ESOPHAGOGASTRODUODENOSCOPY (EGD) WITH PROPOFOL N/A 06/05/2018   Procedure: ESOPHAGOGASTRODUODENOSCOPY (EGD) WITH PROPOFOL;  Surgeon: Christena Deem, MD;  Location: [facility] ENDOSCOPY;  Service: Endoscopy;  Laterality: N/A;   EYE SURGERY     HERNIA REPAIR     KNEE CAPSULOTOMY Right    Patient Active Problem List   Diagnosis Date Noted   Fever of unknown origin (FUO), generalized weakness 02/21/2022   Weakness    Parkinson's disease 02/19/2022   Major depressive disorder, recurrent, mild 02/19/2022   Visual disturbance as complication of stroke 02/19/2022   PAD (peripheral artery disease) 01/11/2020   Tubular adenoma 12/30/2019   Aortic atherosclerosis 09/14/2019   Benign essential hypertension 09/14/2019   DM type 2 with diabetic mixed hyperlipidemia 09/14/2019   Medicare annual wellness visit, initial 09/14/2019   Panlobular emphysema 09/14/2019   Hypertension 01/28/2017   Hyperlipidemia  01/28/2017   Bronchitis 01/28/2017   History of COPD 01/28/2017   Hx of retinal detachment 01/28/2017   Hx of diabetes mellitus 01/28/2017    ONSET DATE: 12/2021  REFERRING DIAG: Parkinson's  THERAPY DIAG:  Muscle weakness (generalized)  Rationale for Evaluation and Treatment: Rehabilitation  SUBJECTIVE:   SUBJECTIVE STATEMENT:  Pt reports he is doing well, has been enjoying the nice weather this week and has been working on his exercises at home as well as walking daily and in the community.  Pt. accompanied by: family member  PERTINENT HISTORY: Pt. Is a 77 y.o. male who was diagnosed with Parkinson's Disease in February of 2023. Pt. Was hospitalized with encephalopathy in 12/2021. Pt. PMHx includes: HTN, DM Type II, Macular Degeneration of the right eye, Stroke behind the left eye.   PRECAUTIONS: None  WEIGHT BEARING RESTRICTIONS: No  PAIN:  Are you having pain? No pain in the left shoulder   FALLS: Has patient fallen in last 6 months? No, one episode of a near fall into a wall.   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  OBJECTIVE:   HAND DOMINANCE: Right  ADLs: Overall ADLs: Independent Equipment: Grab bars  IADLs: Light housekeeping: Assists with taking out the  trash, dishes, vacuuming.  Meal Prep: Perfromed light meal prep including soup, sandwiches Community mobility: Drives, does not drive if the weather is bad Medication management: Pt.'s wife assists with Pillbox set-up. Pt. Is responsible for taking meds, sets phone alarm to alert medication time Financial management: No change in the process; Reports shared duties 50% Handwriting: Not legible  MOBILITY STATUS: Independent with Freezing/Hesitation upon initiation  POSTURE  COMMENTS:  rounded shoulders   FUNCTIONAL OUTCOME MEASURES: FOTO: 86 , 10th visit: 93  TUG: 9 sec. 10th visit:  9 sec  5x's Sit to Stand: 13 sec., 10th visit:  11 sec  6 min. Walk Test: 1270 ft., 10th visit:  1245 feet  Freezing of Gait: 7/20 , 10th visit:  denies incidences of hesitation or freezing of gait.  Berg Balance Test: 50/56, 10th visit: 52/56  UPPER EXTREMITY ROM:    Active ROM Right WFL Left [facility]  Shoulder flexion    Shoulder abduction    Shoulder adduction    Shoulder extension    Shoulder internal rotation    Shoulder external rotation    Elbow flexion    Elbow extension    Wrist flexion    Wrist extension    Wrist ulnar deviation    Wrist radial deviation    Wrist pronation    Wrist supination    (Blank rows = not tested)  UPPER EXTREMITY MMT:     MMT Right eval Left eval  Shoulder flexion 5/5 5/5  Shoulder abduction 5/5 5/5  Shoulder adduction    Shoulder extension    Shoulder internal rotation    Shoulder external rotation    Middle trapezius    Lower trapezius    Elbow flexion 5/5 5/5  Elbow extension 5/5 5/5  Wrist flexion    Wrist extension 5/5 5/5  Wrist ulnar deviation    Wrist radial deviation    Wrist pronation    Wrist supination    (Blank rows = not tested)  HAND FUNCTION: Grip strength: Right: 54 lbs; Left: 55 lbs, Lateral pinch: Right: 25 lbs, Left: 27 lbs, and 3 point pinch: Right: 20 lbs, Left: 18 lbs  COORDINATION: 9 Hole Peg test: Right: 36 sec; Left: 32 sec  SENSATION: Not tested  EDEMA: N/A  COGNITION: Overall cognitive status: Within functional limits for tasks assessed  VISION: Subjective report:  Baseline vision: Wears glasses Visual history: macular degeneration: right eye, History of stroke behind left eye  VISION ASSESSMENT: To be further assessed in functional context  PRAXIS: WFL   TODAY'S TREATMENT:                                                                                                                               DATE: 03/11/2023   LSVT: Patient seen for LSVT Daily Session Maximal Daily Exercises for facilitation/coordination of movement Maximum Sustained Movements are designed to rescale the amplitude of movement output for generalization to daily functional activities. Performed as  follows for 1 set of 10 repetitions each multi-directional sustained movements: 1) Floor to ceiling- Pt. continues to require fewer cues for movement patterns, hand position, and posture. Pt. performed the task with bilateral hand flicks with emphasis placed on digit extension, and thumb abduction. 2) Side to side multidirectional- Pt. worked on alternating sides with every repetition. Pt. Performed the task with bilateral hand flicks  with cues for digit extension, and thumb abduction.   Repetitive movements performed in standing and are designed to provide retraining effort needed for sustained muscle activation in tasks. Performed as follows for 1 set of 10 repetitions each of multi-directional repetitive movements:  The movements were performed in the standard position no chair needed.  3) Step and reach forward step-   4) Step and reach sideways step-    5) Step and reach backwards step- Pt.  Required cues initially 6) Rock and reach forward/backward- Pt. Required cues initially     7) Rock and reach sideways- Pt. Required minimal cues initially.   Functional Component Task:   Sit to stand: Pt. is improving with amplitude of movements during sit to stands this week.   Writing:  Pt. worked on Theme park manager, and a written sentence.    Posterior Arm swing:   Pt. worked on increasing the amplitude of bilateral UE armswing with feedback posteriorly using the wall with 3# weights. For the right followed by the left.   BIG ambulation:     Pt. performed 1555 ft. Of BIG ambulation with increased cues for form, and amplitude of BUE, and LE movements.  400 ft.  Marching with high knees followed by 97 ft. Side stepping leading with the right side, followed by 97 ft.sidestepping leading with the left side.    PATIENT EDUCATION: Education details: OT services POC, Goals Person educated: Patient and Spouse Education method: Explanation, Demonstration, Tactile cues, and Verbal cues Education comprehension: verbalized understanding and returned demonstration  HOME EXERCISE PROGRAM:  Continue to provide ongoing HEP training as needed.   GOALS: Goals reviewed with patient? Yes  SHORT TERM GOALS: Target date: 03/06/2023      Pt. Will demonstrate independence with HEPs  Baseline: Eval: No current HEP, 10th visit:  HEP with continual changes to increase complexity Goal status: IN PROGRESS   LONG TERM GOALS: Target date: 03/27/2023 *Refer to Functional Outcome Measure section above for additional objective data  1.  Pt. will increase FOTO score by 1 point to reflect Pt. perceived improvement with assessment specific ADL/IADL's.  Baseline: Eval: FOTO: 86, 10th visit: 93 Goal status: Progressing  2.  Pt. Will improve right hand [facility] skills by 2 sec. Of speed for improved control with manipulation of small objects. Baseline: Eval: R: 36 sec. L: 32 sec., 10th visit:  R: 34 secs L: 32 secs  Goal status: IN PROGRESS  3.  Pt. Will improve 6 min. walk test score by 100 ft. With minimal cues for improved amplitude of movements. Baseline: Eval: 1270 ft. In 6 min., 10th visit: 1245 feet  Goal status: IN PROGRESS  4.  Pt. Will demonstrate improved dynamic standing balance during daily ADL/IADL tasks.  Baseline: Eval: Berg Balance Test: 50/56, 10th visit: 52/56 Goal status: IN PROGRESS  5.  Pt. Will improve writing legibility to be able to print his first name. Baseline: Eval: Not legible, 10th visit:  pt improving with letter size and legibility  Goal status: IN PROGRESS  ASSESSMENT:  CLINICAL IMPRESSION:  Pt. presents with more steadiness  overall,  and requires significantly fewer cues during the Maximal Daily Exercises. No LOB noted, and a chair was not required for stabilization. Patient continues to demonstrate significant improvements with his handwriting with formulating lists of items with improved legibility and size of written strokes. Pt. Continues to work towards improving the amplitude of movements during ADLs, and IADL tasks.  PERFORMANCE DEFICITS: in functional skills including ADLs, IADLs, dexterity, ROM, strength, Fine motor control, Gross motor control, balance, body mechanics, and endurance, cognitive skills including attention and safety awareness, and psychosocial skills including environmental adaptation and routines and behaviors.   IMPAIRMENTS: are limiting patient from ADLs, IADLs, work, and leisure.   CO-MORBIDITIES: may have co-morbidities  that affects occupational performance. Patient will benefit from skilled OT to address above impairments and improve overall function.  MODIFICATION OR ASSISTANCE TO COMPLETE EVALUATION: Maximum or significant modification of tasks or assist is necessary to complete an evaluation.  OT OCCUPATIONAL PROFILE AND HISTORY: Comprehensive assessment: Review of records and extensive additional review of physical, cognitive, psychosocial history related to current functional performance.  CLINICAL DECISION MAKING: High - multiple treatment options, significant modification of task necessary  REHAB POTENTIAL: Good  EVALUATION COMPLEXITY: High    PLAN:  OT FREQUENCY:  17 visits  OT DURATION: 6 weeks  PLANNED INTERVENTIONS: self care/ADL training, therapeutic exercise, therapeutic activity, neuromuscular re-education, passive range of motion, balance training, and functional mobility training  RECOMMENDED OTHER SERVICES: N/A  CONSULTED AND AGREED WITH PLAN OF CARE: Patient and family member/caregiver  PLAN FOR NEXT SESSION:    Olegario Messier, MS, OTR/L

## 2023-03-12 ENCOUNTER — Ambulatory Visit: Payer: PPO

## 2023-03-12 DIAGNOSIS — R278 Other lack of coordination: Secondary | ICD-10-CM

## 2023-03-12 DIAGNOSIS — M6281 Muscle weakness (generalized): Secondary | ICD-10-CM

## 2023-03-13 ENCOUNTER — Ambulatory Visit: Payer: PPO

## 2023-03-13 DIAGNOSIS — M6281 Muscle weakness (generalized): Secondary | ICD-10-CM

## 2023-03-13 DIAGNOSIS — R278 Other lack of coordination: Secondary | ICD-10-CM

## 2023-03-13 NOTE — Therapy (Addendum)
OUTPATIENT OCCUPATIONAL THERAPY NEURO TREATMENT Patient Name: Darren Sharp MRN: [id number] DOB:[birth year], 77 y.o., male   PCP: Bethann Punches, MD REFERRING PROVIDER: Kerin Salen, MD  END OF SESSION:   OT End of Session - 03/13/23 2104     Visit Number 15    Number of Visits 17    Date for OT Re-Evaluation 03/27/23    OT Start Time 1300    OT Stop Time 1400    OT Time Calculation (min) 60 min    Activity Tolerance Patient tolerated treatment well    Behavior During Therapy WFL for tasks assessed/performed                       Past Medical History:  Diagnosis Date   COPD (chronic obstructive pulmonary disease)    COPD (chronic obstructive pulmonary disease)    Diabetes mellitus without complication    Hypertension    Past Surgical History:  Procedure Laterality Date   APPENDECTOMY     COLONOSCOPY WITH PROPOFOL N/A 06/05/2018   Procedure: COLONOSCOPY WITH PROPOFOL;  Surgeon: Christena Deem, MD;  Location: [facility] ENDOSCOPY;  Service: Endoscopy;  Laterality: N/A;   ESOPHAGOGASTRODUODENOSCOPY (EGD) WITH PROPOFOL N/A 06/05/2018   Procedure: ESOPHAGOGASTRODUODENOSCOPY (EGD) WITH PROPOFOL;  Surgeon: Christena Deem, MD;  Location: [facility] ENDOSCOPY;  Service: Endoscopy;  Laterality: N/A;   EYE SURGERY     HERNIA REPAIR     KNEE CAPSULOTOMY Right    Patient Active Problem List   Diagnosis Date Noted   Fever of unknown origin (FUO), generalized weakness 02/21/2022   Weakness    Parkinson's disease 02/19/2022   Major depressive disorder, recurrent, mild 02/19/2022   Visual disturbance as complication of stroke 02/19/2022   PAD (peripheral artery disease) 01/11/2020   Tubular adenoma 12/30/2019   Aortic atherosclerosis 09/14/2019   Benign essential hypertension 09/14/2019   DM type 2 with diabetic mixed hyperlipidemia 09/14/2019   Medicare annual wellness visit, initial 09/14/2019   Panlobular emphysema 09/14/2019   Hypertension 01/28/2017   Hyperlipidemia  01/28/2017   Bronchitis 01/28/2017   History of COPD 01/28/2017   Hx of retinal detachment 01/28/2017   Hx of diabetes mellitus 01/28/2017    ONSET DATE: 12/2021  REFERRING DIAG: Parkinson's  THERAPY DIAG:  Muscle weakness (generalized)  Other lack of coordination  Rationale for Evaluation and Treatment: Rehabilitation  SUBJECTIVE:   SUBJECTIVE STATEMENT: Pt reports doing well today.  Spouse is happy to see pt's progress with balance and posture.  Pt. accompanied by: family member  PERTINENT HISTORY: Pt. Is a 77 y.o. male who was diagnosed with Parkinson's Disease in February of 2023. Pt. Was hospitalized with encephalopathy in 12/2021. Pt. PMHx includes: HTN, DM Type II, Macular Degeneration of the right eye, Stroke behind the left eye.   PRECAUTIONS: None  WEIGHT BEARING RESTRICTIONS: No  PAIN:  Are you having pain? No pain   FALLS: Has patient fallen in last 6 months? No, one episode of a near fall into a wall.   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  OBJECTIVE:   HAND DOMINANCE: Right  ADLs: Overall ADLs: Independent Equipment: Grab bars  IADLs: Light housekeeping: Assists with taking out the trash, dishes, vacuuming.  Meal Prep: Perfromed light meal prep including soup, sandwiches Community mobility:  Drives, does not drive if the weather is bad Medication management: Pt.'s wife assists with Pillbox set-up. Pt. Is responsible for taking meds, sets phone alarm to alert medication time Financial management: No change in the process; Reports shared duties 50% Handwriting: Not legible  MOBILITY STATUS: Independent with Freezing/Hesitation upon initiation  POSTURE COMMENTS:  rounded shoulders   FUNCTIONAL OUTCOME MEASURES: FOTO: 86  , 10th visit: 93  TUG: 9 sec. 10th visit:  9 sec  5x's Sit to Stand: 13 sec., 10th visit:  11 sec  6 min. Walk Test: 1270 ft., 10th visit:  1245 feet  Freezing of Gait: 7/20 , 10th visit:  denies incidences of hesitation or freezing of gait.  Berg Balance Test: 50/56, 10th visit: 52/56  UPPER EXTREMITY ROM:    Active ROM Right WFL Left [facility]  Shoulder flexion    Shoulder abduction    Shoulder adduction    Shoulder extension    Shoulder internal rotation    Shoulder external rotation    Elbow flexion    Elbow extension    Wrist flexion    Wrist extension    Wrist ulnar deviation    Wrist radial deviation    Wrist pronation    Wrist supination    (Blank rows = not tested)  UPPER EXTREMITY MMT:     MMT Right eval Left eval  Shoulder flexion 5/5 5/5  Shoulder abduction 5/5 5/5  Shoulder adduction    Shoulder extension    Shoulder internal rotation    Shoulder external rotation    Middle trapezius    Lower trapezius    Elbow flexion 5/5 5/5  Elbow extension 5/5 5/5  Wrist flexion    Wrist extension 5/5 5/5  Wrist ulnar deviation    Wrist radial deviation    Wrist pronation    Wrist supination    (Blank rows = not tested)  HAND FUNCTION: Grip strength: Right: 54 lbs; Left: 55 lbs, Lateral pinch: Right: 25 lbs, Left: 27 lbs, and 3 point pinch: Right: 20 lbs, Left: 18 lbs  COORDINATION: 9 Hole Peg test: Right: 36 sec; Left: 32 sec  SENSATION: Not tested  EDEMA: N/A  COGNITION: Overall cognitive status: Within functional limits for tasks assessed  VISION: Subjective report:  Baseline vision: Wears glasses Visual history: macular degeneration: right eye, History of stroke behind left eye  VISION ASSESSMENT: To be further assessed in functional context  PRAXIS: WFL   TODAY'S TREATMENT:                                                                                                                              DATE: 03/12/2023  Neuro  re-ed: LSVT: Patient seen for LSVT Daily Session Maximal Daily Exercises for facilitation/coordination of movement Maximum Sustained Movements are designed to rescale the amplitude of movement output for generalization to daily functional activities. Performed as follows for 1 set of 10 repetitions each multi-directional sustained movements: 1) Floor to  ceiling- Pt. performed the task with bilateral hand flicks with emphasis placed on digit extension, and thumb abduction. 2) Side to side multidirectional- Pt. worked on alternating sides with every repetition. Pt. Performed the task with bilateral hand flicks with cues for digit extension, and thumb abduction.  Min tactile cue for more erect chest and increased lift of the arm being held.  Min vc to step the foot back rather than slide.   Repetitive movements performed in standing and are designed to provide retraining effort needed for sustained muscle activation in tasks. Performed as follows for 1 set of 10 repetitions each of multi-directional repetitive movements:  The movements were performed in the standard position no chair needed.  3) Step and reach forward step-   4) Step and reach sideways step-  min vc and tactile cues for increased lift of the L arm when stepping R and the R arm when stepping L. 5) Step and reach backwards step- Pt. Required cues initially 6) Rock and reach forward/backward- Pt. Required cues initially     7) Rock and reach sideways- Pt. Required minimal cues initially.  Posterior Arm swing:   Pt. worked on increasing the amplitude of bilateral UE armswing with feedback posteriorly using the wall with 3# weights. For the right followed by the left.  Completed 3 sets 10 reps each.   BIG ambulation:   BIG ambulation to end of healing garden hallway and back to clinic with min vc cues for form, and amplitude of BUE, and LE movements.     Functional Component Task:   Sit to stand: Pt. is improving with amplitude of  movements during sit to stands this week.   Self Care: Writing: Pt. worked on Theme park manager, and short written sentences.  Mod verbal and visual cues to increase letter and word spacing and for positioning self close enough to the table to allow R elbow and forearm to rest on table top for increased distal control.   PATIENT EDUCATION: Education details: benefits of daily handwriting  Person educated: Patient and Spouse Education method: Explanation, Demonstration, Tactile cues, and Verbal cues Education comprehension: verbalized understanding and returned demonstration  HOME EXERCISE PROGRAM:  Continue to provide ongoing HEP training as needed.   GOALS: Goals reviewed with patient? Yes  SHORT TERM GOALS: Target date: 03/06/2023      Pt. Will demonstrate independence with HEPs  Baseline: Eval: No current HEP, 10th visit:  HEP with continual changes to increase complexity Goal status: IN PROGRESS   LONG TERM GOALS: Target date: 03/27/2023 *Refer to Functional Outcome Measure section above for additional objective data  1.  Pt. will increase FOTO score by 1 point to reflect Pt. perceived improvement with assessment specific ADL/IADL's.  Baseline: Eval: FOTO: 86, 10th visit: 93 Goal status: Progressing  2.  Pt. Will improve right hand [facility] skills by 2 sec. Of speed for improved control with manipulation of small objects. Baseline: Eval: R: 36 sec. L: 32 sec., 10th visit:  R: 34 secs L: 32 secs  Goal status: IN PROGRESS  3.  Pt. Will improve 6 min. walk test score by 100 ft. With minimal cues for improved amplitude of movements. Baseline: Eval: 1270 ft. In 6 min., 10th visit: 1245 feet  Goal status: IN PROGRESS  4.  Pt. Will demonstrate improved dynamic standing balance during daily ADL/IADL tasks.  Baseline: Eval: Berg Balance Test: 50/56, 10th visit: 52/56 Goal status: IN PROGRESS  5.  Pt. Will improve writing  legibility to be able to print his  first name. Baseline: Eval: Not legible, 10th visit:  pt improving with letter size and legibility  Goal status: IN PROGRESS  ASSESSMENT:  CLINICAL IMPRESSION: Pt continues to progress level of indep with MDE, requiring only intermittent verbal and tactile cues to increase lift of arms, specifically on the side to side multidirectional sustained movement, the step and reach sideways, and the rock and reach sideways.  Pt also required min vc for stepping vs sliding feet on the side to side multidirectional sustained movement.  Once provided with these cues, pt was able to maintain better form for remaining reps.  Pt presents with more steadiness overall.  No LOB noted, and a chair was not required for stabilization. Patient continues to demonstrate significant improvements with his handwriting with formulating lists of items with improved legibility and size of written strokes.  Legibility further improved with cues for increasing letter and word spacing. Pt. Continues to work towards improving the amplitude of movements during ADLs, and IADL tasks.  PERFORMANCE DEFICITS: in functional skills including ADLs, IADLs, dexterity, ROM, strength, Fine motor control, Gross motor control, balance, body mechanics, and endurance, cognitive skills including attention and safety awareness, and psychosocial skills including environmental adaptation and routines and behaviors.   IMPAIRMENTS: are limiting patient from ADLs, IADLs, work, and leisure.   CO-MORBIDITIES: may have co-morbidities  that affects occupational performance. Patient will benefit from skilled OT to address above impairments and improve overall function.  MODIFICATION OR ASSISTANCE TO COMPLETE EVALUATION: Maximum or significant modification of tasks or assist is necessary to complete an evaluation.  OT OCCUPATIONAL PROFILE AND HISTORY: Comprehensive assessment: Review of records and extensive additional review of physical, cognitive,  psychosocial history related to current functional performance.  CLINICAL DECISION MAKING: High - multiple treatment options, significant modification of task necessary  REHAB POTENTIAL: Good  EVALUATION COMPLEXITY: High    PLAN:  OT FREQUENCY:  17 visits  OT DURATION: 6 weeks  PLANNED INTERVENTIONS: self care/ADL training, therapeutic exercise, therapeutic activity, neuromuscular re-education, passive range of motion, balance training, and functional mobility training  RECOMMENDED OTHER SERVICES: N/A  CONSULTED AND AGREED WITH PLAN OF CARE: Patient and family member/caregiver  PLAN FOR NEXT SESSION:    Danelle Earthly, MS, OTR/L

## 2023-03-13 NOTE — Therapy (Signed)
OUTPATIENT OCCUPATIONAL THERAPY NEURO TREATMENT Patient Name: Darren Sharp MRN: [id number] DOB:Aug 09, 1946, 77 y.o., male   PCP: Bethann Punches, MD REFERRING PROVIDER: Kerin Salen, MD  END OF SESSION:   OT End of Session - 03/13/23 2128     Visit Number 16    Number of Visits 17    Date for OT Re-Evaluation 03/27/23    OT Start Time 1300    OT Stop Time 1400    OT Time Calculation (min) 60 min    Activity Tolerance Patient tolerated treatment well    Behavior During Therapy WFL for tasks assessed/performed                       Past Medical History:  Diagnosis Date   COPD (chronic obstructive pulmonary disease)    COPD (chronic obstructive pulmonary disease)    Diabetes mellitus without complication    Hypertension    Past Surgical History:  Procedure Laterality Date   APPENDECTOMY     COLONOSCOPY WITH PROPOFOL N/A 06/05/2018   Procedure: COLONOSCOPY WITH PROPOFOL;  Surgeon: Christena Deem, MD;  Location: [facility] ENDOSCOPY;  Service: Endoscopy;  Laterality: N/A;   ESOPHAGOGASTRODUODENOSCOPY (EGD) WITH PROPOFOL N/A 06/05/2018   Procedure: ESOPHAGOGASTRODUODENOSCOPY (EGD) WITH PROPOFOL;  Surgeon: Christena Deem, MD;  Location: [facility] ENDOSCOPY;  Service: Endoscopy;  Laterality: N/A;   EYE SURGERY     HERNIA REPAIR     KNEE CAPSULOTOMY Right    Patient Active Problem List   Diagnosis Date Noted   Fever of unknown origin (FUO), generalized weakness 02/21/2022   Weakness    Parkinson's disease 02/19/2022   Major depressive disorder, recurrent, mild 02/19/2022   Visual disturbance as complication of stroke 02/19/2022   PAD (peripheral artery disease) 01/11/2020   Tubular adenoma 12/30/2019   Aortic atherosclerosis 09/14/2019   Benign essential hypertension 09/14/2019   DM type 2 with diabetic mixed hyperlipidemia 09/14/2019   Medicare annual wellness visit, initial 09/14/2019   Panlobular emphysema 09/14/2019   Hypertension 01/28/2017   Hyperlipidemia  01/28/2017   Bronchitis 01/28/2017   History of COPD 01/28/2017   Hx of retinal detachment 01/28/2017   Hx of diabetes mellitus 01/28/2017    ONSET DATE: 12/2021  REFERRING DIAG: Parkinson's  THERAPY DIAG:  Muscle weakness (generalized)  Other lack of coordination  Rationale for Evaluation and Treatment: Rehabilitation  SUBJECTIVE:   SUBJECTIVE STATEMENT: Pt and spouse acknowledged plan for discharge tomorrow.  Both report doing well today.  Pt. accompanied by: family member  PERTINENT HISTORY: Pt. Is a 77 y.o. male who was diagnosed with Parkinson's Disease in February of 2023. Pt. Was hospitalized with encephalopathy in 12/2021. Pt. PMHx includes: HTN, DM Type II, Macular Degeneration of the right eye, Stroke behind the left eye.   PRECAUTIONS: None  WEIGHT BEARING RESTRICTIONS: No  PAIN:  Are you having pain? No pain   FALLS: Has patient fallen in last 6 months? No, one episode of a near fall into a wall.   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  OBJECTIVE:   HAND DOMINANCE: Right  ADLs: Overall ADLs: Independent Equipment: Grab bars  IADLs: Light housekeeping: Assists with taking out the trash, dishes, vacuuming.  Meal Prep: Perfromed light meal prep including soup, sandwiches Community mobility: Drives, does not  drive if the weather is bad Medication management: Pt.'s wife assists with Pillbox set-up. Pt. Is responsible for taking meds, sets phone alarm to alert medication time Financial management: No change in the process; Reports shared duties 50% Handwriting: Not legible  MOBILITY STATUS: Independent with Freezing/Hesitation upon initiation  POSTURE COMMENTS:  rounded shoulders   FUNCTIONAL OUTCOME MEASURES: FOTO: 86 , 10th  visit: 93  TUG: 9 sec. 10th visit:  9 sec  5x's Sit to Stand: 13 sec., 10th visit:  11 sec  6 min. Walk Test: 1270 ft., 10th visit:  1245 feet  Freezing of Gait: 7/20 , 10th visit:  denies incidences of hesitation or freezing of gait.  Berg Balance Test: 50/56, 10th visit: 52/56  UPPER EXTREMITY ROM:    Active ROM Right WFL Left [facility]  Shoulder flexion    Shoulder abduction    Shoulder adduction    Shoulder extension    Shoulder internal rotation    Shoulder external rotation    Elbow flexion    Elbow extension    Wrist flexion    Wrist extension    Wrist ulnar deviation    Wrist radial deviation    Wrist pronation    Wrist supination    (Blank rows = not tested)  UPPER EXTREMITY MMT:     MMT Right eval Left eval  Shoulder flexion 5/5 5/5  Shoulder abduction 5/5 5/5  Shoulder adduction    Shoulder extension    Shoulder internal rotation    Shoulder external rotation    Middle trapezius    Lower trapezius    Elbow flexion 5/5 5/5  Elbow extension 5/5 5/5  Wrist flexion    Wrist extension 5/5 5/5  Wrist ulnar deviation    Wrist radial deviation    Wrist pronation    Wrist supination    (Blank rows = not tested)  HAND FUNCTION: Grip strength: Right: 54 lbs; Left: 55 lbs, Lateral pinch: Right: 25 lbs, Left: 27 lbs, and 3 point pinch: Right: 20 lbs, Left: 18 lbs  COORDINATION: 9 Hole Peg test: Right: 36 sec; Left: 32 sec  SENSATION: Not tested  EDEMA: N/A  COGNITION: Overall cognitive status: Within functional limits for tasks assessed  VISION: Subjective report:  Baseline vision: Wears glasses Visual history: macular degeneration: right eye, History of stroke behind left eye  VISION ASSESSMENT: To be further assessed in functional context  PRAXIS: WFL   TODAY'S TREATMENT:                                                                                                                              DATE: 03/13/2023  Neuro re-ed: LSVT: Patient seen  for LSVT Daily Session Maximal Daily Exercises for facilitation/coordination of movement Maximum Sustained Movements are designed to rescale the amplitude of movement output for generalization to daily functional activities. Performed as follows for 1 set of 10 repetitions each multi-directional sustained movements: 1) Floor to ceiling- Pt. performed  the task with bilateral hand flicks with emphasis placed on digit extension, and thumb abduction. 2) Side to side multidirectional- Pt. worked on alternating sides with every repetition. Pt. Performed the task with bilateral hand flicks with cues for digit extension, and thumb abduction.  Min tactile cue for more erect chest and increased lift of the arm being held.  1 vc to step the foot back rather than slide.   Repetitive movements performed in standing and are designed to provide retraining effort needed for sustained muscle activation in tasks. Performed as follows for 1 set of 10 repetitions each of multi-directional repetitive movements:  The movements were performed in the standard position no chair needed.  3) Step and reach forward step-   4) Step and reach sideways step-  min vc and tactile cues for increased lift of the L arm when stepping R and the R arm when stepping L. 5) Step and reach backwards step- able to initiate with good form 6) Rock and reach forward/backward- able to initiate with good form   7) Rock and reach sideways- 1 vc/tactile cue to increase lift of the opposite arm from the direction of the reach  BIG ambulation:   BIG ambulation to end of healing garden hallway and back to clinic with min vc cues for form, and amplitude of BUE, and LE movements.  Dual tasking added with conversation with good maintenance of form.   Functional Component Task:   Sit to stand: Pt. is improving with amplitude of movements during sit to stands this week.   Balance: High level dynamic standing balance addressed with pt standing on Bosu  ball x5 min for 1 trial and 3.5 min for a 2nd trial, while using R hand to place jumbo pegs from container into pegboard.  Container and pegboard positioning was alternated to facilitate forward and lateral reaching patterns.  Pt able to maintain balance throughout with min guard and min A only initially to find center of balance before beginning the activity.  Self Care: Writing: Pt. worked on Theme park manager, and short written sentences.  Min vc to increase letter and word spacing and for positioning self close enough to the table to allow R elbow and forearm to rest on table top for increased distal control.     PATIENT EDUCATION: Education details: benefits of daily handwriting  Person educated: Patient and Spouse Education method: Explanation, Demonstration, Tactile cues, and Verbal cues Education comprehension: verbalized understanding and returned demonstration  HOME EXERCISE PROGRAM: Continue to provide ongoing HEP training as needed.  GOALS: Goals reviewed with patient? Yes  SHORT TERM GOALS: Target date: 03/06/2023  Pt. Will demonstrate independence with HEPs  Baseline: Eval: No current HEP, 10th visit:  HEP with continual changes to increase complexity Goal status: IN PROGRESS   LONG TERM GOALS: Target date: 03/27/2023 *Refer to Functional Outcome Measure section above for additional objective data  1.  Pt. will increase FOTO score by 1 point to reflect Pt. perceived improvement with assessment specific ADL/IADL's.  Baseline: Eval: FOTO: 86, 10th visit: 93 Goal status: Progressing  2.  Pt. Will improve right hand [facility] skills by 2 sec. Of speed for improved control with manipulation of small objects. Baseline: Eval: R: 36 sec. L: 32 sec., 10th visit:  R: 34 secs L: 32 secs  Goal status: IN PROGRESS  3.  Pt. Will improve 6 min. walk test score by 100 ft. With minimal cues for improved amplitude of movements. Baseline: Eval: 1270  ft. In 6 min.,  10th visit: 1245 feet  Goal status: IN PROGRESS  4.  Pt. Will demonstrate improved dynamic standing balance during daily ADL/IADL tasks.  Baseline: Eval: Berg Balance Test: 50/56, 10th visit: 52/56 Goal status: IN PROGRESS  5.  Pt. Will improve writing legibility to be able to print his first name. Baseline: Eval: Not legible, 10th visit:  pt improving with letter size and legibility  Goal status: IN PROGRESS  ASSESSMENT:  CLINICAL IMPRESSION: Pt continues to progress level of indep with MDE, requiring fewer verbal and tactile cues to increase lift of arms during sustained and repetitive movements.  Pt required 1 vc for stepping vs sliding feet on the side to side multidirectional sustained movement today.  Once provided with these cues, pt was able to maintain better form for remaining reps.  Pt presents with more steadiness overall and able to complete all MDE without support of chair.  Pt tolerated 5 min then 3.5 min on Bosu ball for dynamic standing activity, maintaining balance on ball while performing forward and lateral reaching patterns for the RUE with min guard.  Pt required min A only to find center of balance initially before beginning the activity.  Patient continues to demonstrate significant improvements with his handwriting with formulating lists of items with improved legibility and size of written strokes.  Pt required fewer cues for letter and word spacing today.  Planning completion of MDE and discharge assessment next session.  Pt/spouse in agreement with plan.  PERFORMANCE DEFICITS: in functional skills including ADLs, IADLs, dexterity, ROM, strength, Fine motor control, Gross motor control, balance, body mechanics, and endurance, cognitive skills including attention and safety awareness, and psychosocial skills including environmental adaptation and routines and behaviors.   IMPAIRMENTS: are limiting patient from ADLs, IADLs, work, and leisure.   CO-MORBIDITIES: may have  co-morbidities  that affects occupational performance. Patient will benefit from skilled OT to address above impairments and improve overall function.  MODIFICATION OR ASSISTANCE TO COMPLETE EVALUATION: Maximum or significant modification of tasks or assist is necessary to complete an evaluation.  OT OCCUPATIONAL PROFILE AND HISTORY: Comprehensive assessment: Review of records and extensive additional review of physical, cognitive, psychosocial history related to current functional performance.  CLINICAL DECISION MAKING: High - multiple treatment options, significant modification of task necessary  REHAB POTENTIAL: Good  EVALUATION COMPLEXITY: High  PLAN:  OT FREQUENCY:  17 visits  OT DURATION: 6 weeks  PLANNED INTERVENTIONS: self care/ADL training, therapeutic exercise, therapeutic activity, neuromuscular re-education, passive range of motion, balance training, and functional mobility training  RECOMMENDED OTHER SERVICES: N/A  CONSULTED AND AGREED WITH PLAN OF CARE: Patient and family member/caregiver  PLAN FOR NEXT SESSION:    Danelle Earthly, MS, OTR/L

## 2023-03-14 ENCOUNTER — Ambulatory Visit: Payer: PPO | Admitting: Occupational Therapy

## 2023-03-14 DIAGNOSIS — R278 Other lack of coordination: Secondary | ICD-10-CM

## 2023-03-14 DIAGNOSIS — M6281 Muscle weakness (generalized): Secondary | ICD-10-CM

## 2023-03-14 NOTE — Therapy (Addendum)
OUTPATIENT OCCUPATIONAL THERAPY NEURO TREATMENT/DISCHARGE NOTE Patient Name: Darren Sharp MRN: [id number] DOB:September 22, 1946, 77 y.o., male   PCP: Bethann Punches, MD REFERRING PROVIDER: Kerin Salen, MD  END OF SESSION:   OT End of Session - 03/14/23 1306     Visit Number 17    Number of Visits 17    Date for OT Re-Evaluation 03/27/23    OT Start Time 1303    OT Stop Time 1550   OT Time Calculation (min) 47 min    Activity Tolerance Patient tolerated treatment well    Behavior During Therapy WFL for tasks assessed/performed                       Past Medical History:  Diagnosis Date   COPD (chronic obstructive pulmonary disease)    COPD (chronic obstructive pulmonary disease)    Diabetes mellitus without complication    Hypertension    Past Surgical History:  Procedure Laterality Date   APPENDECTOMY     COLONOSCOPY WITH PROPOFOL N/A 06/05/2018   Procedure: COLONOSCOPY WITH PROPOFOL;  Surgeon: Christena Deem, MD;  Location: [facility] ENDOSCOPY;  Service: Endoscopy;  Laterality: N/A;   ESOPHAGOGASTRODUODENOSCOPY (EGD) WITH PROPOFOL N/A 06/05/2018   Procedure: ESOPHAGOGASTRODUODENOSCOPY (EGD) WITH PROPOFOL;  Surgeon: Christena Deem, MD;  Location: [facility] ENDOSCOPY;  Service: Endoscopy;  Laterality: N/A;   EYE SURGERY     HERNIA REPAIR     KNEE CAPSULOTOMY Right    Patient Active Problem List   Diagnosis Date Noted   Fever of unknown origin (FUO), generalized weakness 02/21/2022   Weakness    Parkinson's disease 02/19/2022   Major depressive disorder, recurrent, mild 02/19/2022   Visual disturbance as complication of stroke 02/19/2022   PAD (peripheral artery disease) 01/11/2020   Tubular adenoma 12/30/2019   Aortic atherosclerosis 09/14/2019   Benign essential hypertension 09/14/2019   DM type 2 with diabetic mixed hyperlipidemia 09/14/2019   Medicare annual wellness visit, initial 09/14/2019   Panlobular emphysema 09/14/2019   Hypertension 01/28/2017    Hyperlipidemia 01/28/2017   Bronchitis 01/28/2017   History of COPD 01/28/2017   Hx of retinal detachment 01/28/2017   Hx of diabetes mellitus 01/28/2017    ONSET DATE: 12/2021  REFERRING DIAG: Parkinson's  THERAPY DIAG:  No diagnosis found.  Rationale for Evaluation and Treatment: Rehabilitation  SUBJECTIVE:   SUBJECTIVE STATEMENT: Pt and spouse acknowledged plan for discharge tomorrow.  Both report doing well today.  Pt. accompanied by: family member  PERTINENT HISTORY: Pt. Is a 77 y.o. male who was diagnosed with Parkinson's Disease in February of 2023. Pt. Was hospitalized with encephalopathy in 12/2021. Pt. PMHx includes: HTN, DM Type II, Macular Degeneration of the right eye, Stroke behind the left eye.   PRECAUTIONS: None  WEIGHT BEARING RESTRICTIONS: No  PAIN:  Are you having pain? No pain   FALLS: Has patient fallen in last 6 months? No, one episode of a near fall into a wall.   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  OBJECTIVE:   HAND DOMINANCE: Right  ADLs: Overall ADLs: Independent Equipment: Grab bars  IADLs: Light housekeeping: Assists with taking out the trash, dishes, vacuuming.  Meal Prep: Perfromed light meal prep including soup, sandwiches Community mobility: Drives, does not drive if the weather is  bad Medication management: Pt.'s wife assists with Pillbox set-up. Pt. Is responsible for taking meds, sets phone alarm to alert medication time Financial management: No change in the process; Reports shared duties 50% Handwriting: Not legible  MOBILITY STATUS: Independent with Freezing/Hesitation upon initiation  POSTURE COMMENTS:  rounded shoulders   FUNCTIONAL OUTCOME MEASURES: FOTO: 86 , 10th visit: 93 D/C: 96  TUG:  9 sec. 10th visit:  9 sec., D/C: 7 sec.  5x's Sit to Stand: 13 sec., 10th visit:  11 sec, D/C: 11 sec.  6 min. Walk Test: 1270 ft., 10th visit:  1245 feet, D/C: 1195 with more upright posture, improved arm swing, and increased amplitude of movements.  Freezing of Gait: 7/20 , 10th visit:  denies incidences of hesitation or freezing of gait. D/C: Reports no episodes of freezing of gait  Berg Balance Test: 50/56, 10th visit: 52/56, D/C: 55/56  UPPER EXTREMITY ROM:    Active ROM Right WFL Left WFL  Shoulder flexion    Shoulder abduction    Shoulder adduction    Shoulder extension    Shoulder internal rotation    Shoulder external rotation    Elbow flexion    Elbow extension    Wrist flexion    Wrist extension    Wrist ulnar deviation    Wrist radial deviation    Wrist pronation    Wrist supination    (Blank rows = not tested)  UPPER EXTREMITY MMT:     MMT Right eval Left eval  Shoulder flexion 5/5 5/5  Shoulder abduction 5/5 5/5  Shoulder adduction    Shoulder extension    Shoulder internal rotation    Shoulder external rotation    Middle trapezius    Lower trapezius    Elbow flexion 5/5 5/5  Elbow extension 5/5 5/5  Wrist flexion    Wrist extension 5/5 5/5  Wrist ulnar deviation    Wrist radial deviation    Wrist pronation    Wrist supination    (Blank rows = not tested)  HAND FUNCTION: Grip strength: Right: 54 lbs; Left: 55 lbs, Lateral pinch: Right: 25 lbs, Left: 27 lbs, and 3 point pinch: Right: 20 lbs, Left: 18 lbs  03/14/2023:  Grip strength: Right: 64 lbs., Left: 58 lbs.  COORDINATION: 9 Hole Peg test: Right: 36 sec; Left: 32 sec  03/14/2023:  9 Hole Peg test: Right: 29 sec; Left: 27 sec.  SENSATION: Not tested  EDEMA: N/A  COGNITION: Overall cognitive status: Within functional limits for tasks assessed  VISION: Subjective report:  Baseline vision: Wears glasses Visual history: macular degeneration: right eye, History of stroke behind left  eye  VISION ASSESSMENT: To be further assessed in functional context  PRAXIS: WFL   TODAY'S TREATMENT:                                                                                                                              DATE: 03/14/2023   Measurements  were obtained, and goals were reviewed with the Pt.    PATIENT EDUCATION: Education details: benefits of daily handwriting  Person educated: Patient and Spouse Education method: Explanation, Demonstration, Tactile cues, and Verbal cues Education comprehension: verbalized understanding and returned demonstration  HOME EXERCISE PROGRAM: Continue to provide ongoing HEP training as needed.  GOALS: Goals reviewed with patient? Yes  SHORT TERM GOALS: Target date: 03/06/2023  Pt. Will demonstrate independence with HEPs  Baseline: Eval: No current HEP, 10th visit:  HEP with continual changes to increase complexity D/C: Independent with the Maximal Daily Exercises. Goal status:  Achieved   LONG TERM GOALS: Target date: 03/27/2023 *Refer to Functional Outcome Measure section above for additional objective data  1.  Pt. will increase FOTO score by 1 point to reflect Pt. perceived improvement with assessment specific ADL/IADL's.  Baseline: Eval: FOTO: 86, 10th visit: 93 D/C: FOTO: 96 Goal status:  Achieved  2.  Pt. Will improve right hand [facility] skills by 2 sec. Of speed for improved control with manipulation of small objects. Baseline: Eval: R: 36 sec. L: 32 sec., 10th visit:  R: 34 secs L: 32 secs D/C:  R: 29 secs L: 27 secs Goal status: Achieved  3.  Pt. Will improve 6 min. walk test score by 100 ft. With minimal cues for improved amplitude of movements. Baseline: Eval: 1270 ft. In 6 min., 10th visit: 1245 feet D/C: 1195 ft.with more upright posture, increased amplitude of movement, and improved arm swing. Goal status:  Partially met  4.  Pt. Will demonstrate improved dynamic standing balance during daily ADL/IADL tasks.   Baseline: Eval: Berg Balance Test: 50/56, 10th visit: 52/56 D/C: 55/56 Goal status: Achieved  5.  Pt. Will improve writing legibility to be able to print his first name. Baseline: Eval: Not legible, 10th visit:  Pt. improving with letter size and legibility D/C: Pt. Able to write his name with 75% legibility  Goal status: Achieved  ASSESSMENT:  CLINICAL IMPRESSION:  Measurements were obtained, and goals were reviewed with the Pt. Pt. has made excellent progress overall improving with FOTO score, Timed Up, and Go, Berg balance Scale, and 5x's sit to stand. Pt. has progressed with bilateral UE grip strength, bilateral FMC, speed, and dexterity skills. Pt. has progressed with writing legibility. Pt. is independent with performing HEP for the Maximal Daily Exercises. Pt. Has completed the LSVT BIG program, and is now appropriate for discharge at this time.  PERFORMANCE DEFICITS: in functional skills including ADLs, IADLs, dexterity, ROM, strength, Fine motor control, Gross motor control, balance, body mechanics, and endurance, cognitive skills including attention and safety awareness, and psychosocial skills including environmental adaptation and routines and behaviors.   IMPAIRMENTS: are limiting patient from ADLs, IADLs, work, and leisure.   CO-MORBIDITIES: may have co-morbidities  that affects occupational performance. Patient will benefit from skilled OT to address above impairments and improve overall function.  MODIFICATION OR ASSISTANCE TO COMPLETE EVALUATION: Maximum or significant modification of tasks or assist is necessary to complete an evaluation.  OT OCCUPATIONAL PROFILE AND HISTORY: Comprehensive assessment: Review of records and extensive additional review of physical, cognitive, psychosocial history related to current functional performance.  CLINICAL DECISION MAKING: High - multiple treatment options, significant modification of task necessary  REHAB POTENTIAL:  Good  EVALUATION COMPLEXITY: High  PLAN:  OT FREQUENCY:  17 visits  OT DURATION: 6 weeks  PLANNED INTERVENTIONS: self care/ADL training, therapeutic exercise, therapeutic activity, neuromuscular re-education, passive range of motion, balance training, and functional mobility training  RECOMMENDED OTHER SERVICES: N/A  CONSULTED AND AGREED WITH PLAN OF CARE: Patient and family member/caregiver  PLAN FOR NEXT SESSION:    Olegario Messier, MS, OTR/L

## 2023-04-01 DIAGNOSIS — Z961 Presence of intraocular lens: Secondary | ICD-10-CM | POA: Diagnosis not present

## 2023-04-01 DIAGNOSIS — H53461 Homonymous bilateral field defects, right side: Secondary | ICD-10-CM | POA: Diagnosis not present

## 2023-04-01 DIAGNOSIS — Z8669 Personal history of other diseases of the nervous system and sense organs: Secondary | ICD-10-CM | POA: Diagnosis not present

## 2023-04-01 DIAGNOSIS — H353211 Exudative age-related macular degeneration, right eye, with active choroidal neovascularization: Secondary | ICD-10-CM | POA: Diagnosis not present

## 2023-04-05 NOTE — Progress Notes (Unsigned)
Assessment/Plan:   1.   Parkinsonism, probable idiopathic pd -Biopsy for alpha-synuclein was positive at [facility] clinic.  It is unclear what biopsy sites specifically were positive.  Given this, doubtful this is FTD -He will continue carbidopa/levodopa 25/100, 2 tablets at 8 AM/noon/4 PM -He had neurocognitive testing scheduled for October, 2024 but canceled that. -We discussed that it used to be thought that levodopa would increase risk of melanoma but now it is believed that Parkinsons itself likely increases risk of melanoma. he is to get regular skin checks. -discussed importance of safe, CV exercise   2.  Mood change             -Much improved on lexapro, 10 mg daily.  He will continue the medication.  3.  Lightheadedness/dizziness  -We did orthostatics when occurring and those were negative.  -Doubt related to #1  -Follow-up with primary care  4.  Patient and wife will actually be living in [place] most of the year but they stated that they wanted to maintain care here.  They are already coming back every 7 weeks to see the ophthalmologist for injections.  They can certainly let me know if they change their mind and find a closer movement disorder neurologist.  If not, I will see him back in 6 months.  He looks very good today. Subjective:   Darren Sharp was seen today in follow up for Parkinsons disease.  My previous records were reviewed prior to todays visit as well as outside records available to me.  Escitalopram was started last visit.  They report that the addition was "really good."  Wife states that pt personality was returned to "smooth and nice" and pt agrees.   I left him on the dose of levodopa that he was on from his prior neurologist, but did tell him that I wondered if perhaps we can taper it a bit.  Pt denies falls.   No hallucinations.  We did schedule him for neurocognitive testing, which was not scheduled until October, but they ended up canceling that.  He did  call with significant lightheadedness since our last visit.  Primary care had already taken him off his hydrochlorothiazide.  The rehab facility and the patient stated that the dizziness came on when lying flat and sitting up quickly.  They actually came here and had orthostatics completed and those were completely normal.  We did tell him to follow-up with primary care. It can happen when laying in bed and when getting up.  He had this at the eye doctor when getting a shot in the eyes and the chair was raised up too fast.  Doesn't happen when up and walking.   Patient did attend occupational therapy, the last visit being April 25.  Notes reviewed.  He is still doing therapies on his own.  He overall feels very good.  Patient and wife plan to live in [place] and patient is going to be working there for a [employer].  He is very excited about that and so is his wife.  Current prescribed movement disorder medications: Escitalopram, 10 mg daily (started last visit) Carbidopa/levodopa 25/100, 2 tablets at 8 AM/noon/4 PM   PREVIOUS MEDICATIONS: Depakote 125 mg (took it for a month and they stopped it as they felt patient was more aggressive with poor focus)  ALLERGIES:   Allergies  Allergen Reactions   Penicillins Anaphylaxis and Swelling    Penicillin injection when patient was a child. Experienced  swelling of face, tongue, extremities. Required medical attention    CURRENT MEDICATIONS:  Current Meds  Medication Sig   Acetylcysteine (N-ACETYL-L-CYSTEINE PO) Take 1 capsule by mouth See admin instructions. Take 1 capsule (1000mg ) by mouth every morning and take 1 capsule (1000mg ) by mouth every night   ALPHA LIPOIC ACID PO Take by mouth.   aspirin 81 MG tablet Take 1 tablet (81 mg total) by mouth daily.   carbidopa-levodopa (SINEMET IR) 25-100 MG tablet TAKE 2 TABLETS BY MOUTH 3 (THREE) TIMES DAILY. 8AM/NOON/4PM   escitalopram (LEXAPRO) 10 MG tablet TAKE 1 TABLET BY MOUTH EVERY  DAY   hydrochlorothiazide (HYDRODIURIL) 12.5 MG tablet Take 1 tablet (12.5 mg total) by mouth daily. Hold for next 3 days.   Ipratropium-Albuterol (COMBIVENT RESPIMAT) 20-100 MCG/ACT AERS respimat Inhale 2 puffs into the lungs every 6 (six) hours as needed for wheezing or shortness of breath.   Multiple Vitamin (MULTIVITAMIN) tablet Take 1 tablet by mouth daily.   OVER THE COUNTER MEDICATION Take 1 capsule by mouth daily with lunch. **[employer] - ClearLungs Extra Strength**   rosuvastatin (CRESTOR) 10 MG tablet Take 10 mg by mouth every Monday, Wednesday, and Friday.   umeclidinium-vilanterol (ANORO ELLIPTA) 62.5-25 MCG/ACT AEPB Inhale 1 puff into the lungs daily.   vitamin E 180 MG (400 UNITS) capsule Take 400 Units by mouth daily.     Objective:   PHYSICAL EXAMINATION:    VITALS:   Vitals:   04/08/23 1022  BP: (!) 162/60  Pulse: 64  SpO2: 97%  Weight: 160 lb (72.6 kg)  Height: 5\' 8"  (1.727 m)   Wt Readings from Last 3 Encounters:  04/08/23 160 lb (72.6 kg)  02/27/23 144 lb (65.3 kg)  11/07/22 149 lb 9.6 oz (67.9 kg)     GEN:  The patient appears stated age and is in NAD. HEENT:  Normocephalic, atraumatic.  The mucous membranes are moist. The superficial temporal arteries are without ropiness or tenderness. CV:  RRR Lungs:  CTAB Neck/HEME:  There are no carotid bruits bilaterally.   Neurological examination:   Orientation: The patient is alert and oriented x3.  Cranial nerves: There is good facial symmetry. Extraocular muscles are intact. The visual fields are full to confrontational testing. The speech is fluent and clear. Soft palate rises symmetrically and there is no tongue deviation. Hearing is intact to conversational tone. Sensation: Sensation is intact to light touch throughout. Motor: Strength is at least antigravity x 4.   Movement examination: Tone: There is mild increased tone in the RUE Abnormal movements: there is rare RUE rest  tremor Coordination:  There is no decremation with RAM's, with any form of RAMS, including alternating supination and pronation of the forearm, hand opening and closing, finger taps, heel taps and toe taps.  Gait and Station: The patient has no difficulty arising out of a deep-seated chair without the use of the hands. The patient's stride length is good today with decreased arm swing on the L  I have reviewed and interpreted the following labs independently    Chemistry      Component Value Date/Time   NA 137 02/24/2022 0509   NA 143 02/04/2017 0922   K 3.5 02/24/2022 0509   CL 107 02/24/2022 0509   CO2 20 (L) 02/24/2022 0509   BUN 36 (H) 02/24/2022 0509   BUN 22 02/04/2017 0922   CREATININE 1.25 (H) 02/24/2022 0509      Component Value Date/Time   CALCIUM 8.8 (L) 02/24/2022 6295  ALKPHOS 59 02/21/2022 0942   AST 91 (H) 02/21/2022 0942   ALT 57 (H) 02/21/2022 0942   BILITOT 1.0 02/21/2022 0942   BILITOT 0.5 02/04/2017 0922       Lab Results  Component Value Date   WBC 4.8 02/23/2022   HGB 11.7 (L) 02/23/2022   HCT 34.2 (L) 02/23/2022   MCV 90.7 02/23/2022   PLT 151 02/23/2022    No results found for: "TSH"   Total time spent on today's visit was 30 minutes, including both face-to-face time and nonface-to-face time.  Time included that spent on review of records (prior notes available to me/labs/imaging if pertinent), discussing treatment and goals, answering patient's questions and coordinating care.  Cc:  Danella Penton, MD

## 2023-04-08 ENCOUNTER — Ambulatory Visit: Payer: PPO | Admitting: Neurology

## 2023-04-08 ENCOUNTER — Encounter: Payer: Self-pay | Admitting: Neurology

## 2023-04-08 VITALS — BP 162/60 | HR 64 | Ht 68.0 in | Wt 160.0 lb

## 2023-04-08 DIAGNOSIS — R42 Dizziness and giddiness: Secondary | ICD-10-CM

## 2023-04-08 DIAGNOSIS — G20A1 Parkinson's disease without dyskinesia, without mention of fluctuations: Secondary | ICD-10-CM | POA: Diagnosis not present

## 2023-04-08 DIAGNOSIS — F330 Major depressive disorder, recurrent, mild: Secondary | ICD-10-CM

## 2023-04-08 MED ORDER — CARBIDOPA-LEVODOPA 25-100 MG PO TABS: 2.0000 | ORAL_TABLET | Freq: Three times a day (TID) | ORAL | 2 refills | Status: DC

## 2023-04-08 MED ORDER — ESCITALOPRAM OXALATE 10 MG PO TABS: 10.0000 mg | ORAL_TABLET | Freq: Every day | ORAL | 2 refills | Status: DC

## 2023-05-20 DIAGNOSIS — Z79899 Other long term (current) drug therapy: Secondary | ICD-10-CM | POA: Diagnosis not present

## 2023-05-20 DIAGNOSIS — G20B1 Parkinson's disease with dyskinesia, without mention of fluctuations: Secondary | ICD-10-CM | POA: Diagnosis not present

## 2023-05-20 DIAGNOSIS — J431 Panlobular emphysema: Secondary | ICD-10-CM | POA: Diagnosis not present

## 2023-05-20 DIAGNOSIS — F330 Major depressive disorder, recurrent, mild: Secondary | ICD-10-CM | POA: Diagnosis not present

## 2023-05-20 DIAGNOSIS — E782 Mixed hyperlipidemia: Secondary | ICD-10-CM | POA: Diagnosis not present

## 2023-05-20 DIAGNOSIS — E1169 Type 2 diabetes mellitus with other specified complication: Secondary | ICD-10-CM | POA: Diagnosis not present

## 2023-05-20 DIAGNOSIS — Z0000 Encounter for general adult medical examination without abnormal findings: Secondary | ICD-10-CM | POA: Diagnosis not present

## 2023-05-20 DIAGNOSIS — I700 Atherosclerosis of aorta: Secondary | ICD-10-CM | POA: Diagnosis not present

## 2023-05-20 DIAGNOSIS — Z125 Encounter for screening for malignant neoplasm of prostate: Secondary | ICD-10-CM | POA: Diagnosis not present

## 2023-05-20 DIAGNOSIS — H353211 Exudative age-related macular degeneration, right eye, with active choroidal neovascularization: Secondary | ICD-10-CM | POA: Diagnosis not present

## 2023-06-05 DIAGNOSIS — E1159 Type 2 diabetes mellitus with other circulatory complications: Secondary | ICD-10-CM | POA: Diagnosis not present

## 2023-06-05 DIAGNOSIS — E782 Mixed hyperlipidemia: Secondary | ICD-10-CM | POA: Diagnosis not present

## 2023-06-05 DIAGNOSIS — E1129 Type 2 diabetes mellitus with other diabetic kidney complication: Secondary | ICD-10-CM | POA: Diagnosis not present

## 2023-06-05 DIAGNOSIS — E1142 Type 2 diabetes mellitus with diabetic polyneuropathy: Secondary | ICD-10-CM | POA: Diagnosis not present

## 2023-06-05 DIAGNOSIS — E1139 Type 2 diabetes mellitus with other diabetic ophthalmic complication: Secondary | ICD-10-CM | POA: Diagnosis not present

## 2023-06-05 DIAGNOSIS — R809 Proteinuria, unspecified: Secondary | ICD-10-CM | POA: Diagnosis not present

## 2023-06-05 DIAGNOSIS — M791 Myalgia, unspecified site: Secondary | ICD-10-CM | POA: Diagnosis not present

## 2023-06-17 DIAGNOSIS — H353211 Exudative age-related macular degeneration, right eye, with active choroidal neovascularization: Secondary | ICD-10-CM | POA: Diagnosis not present

## 2023-07-15 DIAGNOSIS — E1169 Type 2 diabetes mellitus with other specified complication: Secondary | ICD-10-CM | POA: Diagnosis not present

## 2023-07-15 DIAGNOSIS — F330 Major depressive disorder, recurrent, mild: Secondary | ICD-10-CM | POA: Diagnosis not present

## 2023-07-15 DIAGNOSIS — E782 Mixed hyperlipidemia: Secondary | ICD-10-CM | POA: Diagnosis not present

## 2023-07-15 DIAGNOSIS — H353211 Exudative age-related macular degeneration, right eye, with active choroidal neovascularization: Secondary | ICD-10-CM | POA: Diagnosis not present

## 2023-08-12 DIAGNOSIS — H353211 Exudative age-related macular degeneration, right eye, with active choroidal neovascularization: Secondary | ICD-10-CM | POA: Diagnosis not present

## 2023-08-13 DIAGNOSIS — M4712 Other spondylosis with myelopathy, cervical region: Secondary | ICD-10-CM | POA: Diagnosis not present

## 2023-08-14 ENCOUNTER — Other Ambulatory Visit: Payer: Self-pay | Admitting: Internal Medicine

## 2023-08-14 DIAGNOSIS — J431 Panlobular emphysema: Secondary | ICD-10-CM

## 2023-08-14 DIAGNOSIS — R918 Other nonspecific abnormal finding of lung field: Secondary | ICD-10-CM

## 2023-08-23 ENCOUNTER — Ambulatory Visit: Payer: PPO

## 2023-08-28 ENCOUNTER — Ambulatory Visit
Admission: RE | Admit: 2023-08-28 | Discharge: 2023-08-28 | Disposition: A | Discharge status: Home / Self Care | Payer: PPO | Source: Ambulatory Visit | Attending: Acute Care | Admitting: Acute Care

## 2023-08-28 DIAGNOSIS — R911 Solitary pulmonary nodule: Secondary | ICD-10-CM | POA: Insufficient documentation

## 2023-08-28 DIAGNOSIS — Z87891 Personal history of nicotine dependence: Secondary | ICD-10-CM | POA: Diagnosis not present

## 2023-08-28 DIAGNOSIS — J432 Centrilobular emphysema: Secondary | ICD-10-CM | POA: Diagnosis not present

## 2023-08-28 DIAGNOSIS — I2510 Atherosclerotic heart disease of native coronary artery without angina pectoris: Secondary | ICD-10-CM | POA: Diagnosis not present

## 2023-09-04 ENCOUNTER — Encounter: Payer: HMO | Admitting: Psychology

## 2023-09-04 DIAGNOSIS — E1159 Type 2 diabetes mellitus with other circulatory complications: Secondary | ICD-10-CM | POA: Diagnosis not present

## 2023-09-04 DIAGNOSIS — E1142 Type 2 diabetes mellitus with diabetic polyneuropathy: Secondary | ICD-10-CM | POA: Diagnosis not present

## 2023-09-04 DIAGNOSIS — E782 Mixed hyperlipidemia: Secondary | ICD-10-CM | POA: Diagnosis not present

## 2023-09-04 DIAGNOSIS — I10 Essential (primary) hypertension: Secondary | ICD-10-CM | POA: Diagnosis not present

## 2023-09-04 DIAGNOSIS — E1129 Type 2 diabetes mellitus with other diabetic kidney complication: Secondary | ICD-10-CM | POA: Diagnosis not present

## 2023-09-04 DIAGNOSIS — R809 Proteinuria, unspecified: Secondary | ICD-10-CM | POA: Diagnosis not present

## 2023-09-04 DIAGNOSIS — E1139 Type 2 diabetes mellitus with other diabetic ophthalmic complication: Secondary | ICD-10-CM | POA: Diagnosis not present

## 2023-09-04 DIAGNOSIS — I700 Atherosclerosis of aorta: Secondary | ICD-10-CM | POA: Diagnosis not present

## 2023-09-12 ENCOUNTER — Encounter: Payer: HMO | Admitting: Psychology

## 2023-09-16 ENCOUNTER — Ambulatory Visit: Admission: RE | Admit: 2023-09-16 | Payer: PPO | Source: Ambulatory Visit

## 2023-09-16 DIAGNOSIS — E782 Mixed hyperlipidemia: Secondary | ICD-10-CM | POA: Diagnosis not present

## 2023-09-16 DIAGNOSIS — I700 Atherosclerosis of aorta: Secondary | ICD-10-CM | POA: Diagnosis not present

## 2023-09-16 DIAGNOSIS — E1142 Type 2 diabetes mellitus with diabetic polyneuropathy: Secondary | ICD-10-CM | POA: Diagnosis not present

## 2023-09-23 ENCOUNTER — Other Ambulatory Visit: Payer: Self-pay

## 2023-09-23 DIAGNOSIS — Z87891 Personal history of nicotine dependence: Secondary | ICD-10-CM

## 2023-09-23 DIAGNOSIS — Z122 Encounter for screening for malignant neoplasm of respiratory organs: Secondary | ICD-10-CM

## 2023-09-25 ENCOUNTER — Other Ambulatory Visit: Payer: Self-pay

## 2023-09-25 ENCOUNTER — Telehealth: Payer: Self-pay | Admitting: Neurology

## 2023-09-25 DIAGNOSIS — G20A1 Parkinson's disease without dyskinesia, without mention of fluctuations: Secondary | ICD-10-CM

## 2023-09-25 MED ORDER — CARBIDOPA-LEVODOPA 25-100 MG PO TABS: 2.0000 | ORAL_TABLET | Freq: Three times a day (TID) | ORAL | 0 refills | Status: DC

## 2023-09-25 NOTE — Telephone Encounter (Signed)
1. Which medications need refilled? (List name and dosage, if known) carbidopa-levodopa  2. Which pharmacy/location is medication to be sent to? (include street and city if local pharmacy) [facility]

## 2023-10-14 DIAGNOSIS — E782 Mixed hyperlipidemia: Secondary | ICD-10-CM | POA: Diagnosis not present

## 2023-10-14 DIAGNOSIS — H353211 Exudative age-related macular degeneration, right eye, with active choroidal neovascularization: Secondary | ICD-10-CM | POA: Diagnosis not present

## 2023-10-14 DIAGNOSIS — J431 Panlobular emphysema: Secondary | ICD-10-CM | POA: Diagnosis not present

## 2023-10-14 DIAGNOSIS — E1169 Type 2 diabetes mellitus with other specified complication: Secondary | ICD-10-CM | POA: Diagnosis not present

## 2023-10-15 NOTE — Progress Notes (Unsigned)
Assessment/Plan:   1.   Parkinsonism, probable idiopathic pd -Biopsy for alpha-synuclein was positive at [facility] clinic.  It is unclear what biopsy sites specifically were positive.  Given this, doubtful this is FTD -He will continue carbidopa/levodopa 25/100, 2 tablets at 8 AM/noon/4 PM -He had neurocognitive testing scheduled for October, 2024 but canceled that. -We discussed that it used to be thought that levodopa would increase risk of melanoma but now it is believed that Parkinsons itself likely increases risk of melanoma. he is to get regular skin checks. -discussed importance of safe, CV exercise   2.  Mood change             -Much improved on lexapro, 10 mg daily.  He will continue the medication.  3.  Lightheadedness/dizziness  -We did orthostatics when occurring and those were negative.  -Doubt related to #1  -Follow-up with primary care  4.  Patient and wife will actually be living in [place] most of the year but they stated that they wanted to maintain care here.  They are already coming back every 7 weeks to see the ophthalmologist for injections.  They can certainly let me know if they change their mind and find a closer movement disorder neurologist.  If not, I will see him back in 6 months.  He looks very good today. Subjective:   Darren Sharp was seen today in follow up for Parkinsons disease.  My previous records were reviewed prior to todays visit as well as outside records available to me.  Patient has been in [place] and is doing well there.  He works at a [employer].  He enjoys that.  He has not had falls.  He is walking a lot at work.  He was just seen last week at his primary care office in [place].  Those notes are reviewed.  Current prescribed movement disorder medications: Escitalopram, 10 mg daily  Carbidopa/levodopa 25/100, 2 tablets at 8 AM/noon/4 PM   PREVIOUS MEDICATIONS: Depakote 125 mg (took it for a month and they stopped it as they  felt patient was more aggressive with poor focus)  ALLERGIES:   Allergies  Allergen Reactions   Penicillins Anaphylaxis and Swelling    Penicillin injection when patient was a child. Experienced swelling of face, tongue, extremities. Required medical attention    CURRENT MEDICATIONS:  No outpatient medications have been marked as taking for the 10/22/23 encounter (Appointment) with Graesyn Schreifels, Octaviano Batty, DO.     Objective:   PHYSICAL EXAMINATION:    VITALS:   There were no vitals filed for this visit.  Wt Readings from Last 3 Encounters:  04/08/23 160 lb (72.6 kg)  02/27/23 144 lb (65.3 kg)  11/07/22 149 lb 9.6 oz (67.9 kg)     GEN:  The patient appears stated age and is in NAD. HEENT:  Normocephalic, atraumatic.  The mucous membranes are moist. The superficial temporal arteries are without ropiness or tenderness. CV:  RRR Lungs:  CTAB Neck/HEME:  There are no carotid bruits bilaterally.   Neurological examination:   Orientation: The patient is alert and oriented x3.  Cranial nerves: There is good facial symmetry. Extraocular muscles are intact. The visual fields are full to confrontational testing. The speech is fluent and clear. Soft palate rises symmetrically and there is no tongue deviation. Hearing is intact to conversational tone. Sensation: Sensation is intact to light touch throughout. Motor: Strength is at least antigravity x 4.   Movement examination: Tone: There is  mild increased tone in the RUE Abnormal movements: there is rare RUE rest tremor Coordination:  There is no decremation with RAM's, with any form of RAMS, including alternating supination and pronation of the forearm, hand opening and closing, finger taps, heel taps and toe taps.  Gait and Station: The patient has no difficulty arising out of a deep-seated chair without the use of the hands. The patient's stride length is good today with decreased arm swing on the L  I have reviewed and interpreted the  following labs independently    Chemistry      Component Value Date/Time   NA 137 02/24/2022 0509   NA 143 02/04/2017 0922   K 3.5 02/24/2022 0509   CL 107 02/24/2022 0509   CO2 20 (L) 02/24/2022 0509   BUN 36 (H) 02/24/2022 0509   BUN 22 02/04/2017 0922   CREATININE 1.25 (H) 02/24/2022 0509      Component Value Date/Time   CALCIUM 8.8 (L) 02/24/2022 0509   ALKPHOS 59 02/21/2022 0942   AST 91 (H) 02/21/2022 0942   ALT 57 (H) 02/21/2022 0942   BILITOT 1.0 02/21/2022 0942   BILITOT 0.5 02/04/2017 0922       Lab Results  Component Value Date   WBC 4.8 02/23/2022   HGB 11.7 (L) 02/23/2022   HCT 34.2 (L) 02/23/2022   MCV 90.7 02/23/2022   PLT 151 02/23/2022    No results found for: "TSH"   Total time spent on today's visit was *** minutes, including both face-to-face time and nonface-to-face time.  Time included that spent on review of records (prior notes available to me/labs/imaging if pertinent), discussing treatment and goals, answering patient's questions and coordinating care.  Cc:  Danella Penton, MD

## 2023-10-22 ENCOUNTER — Ambulatory Visit: Payer: PPO | Admitting: Neurology

## 2024-01-02 NOTE — Progress Notes (Signed)
Assessment/Plan:   1.   Parkinsonism, probable idiopathic pd -Biopsy for alpha-synuclein was positive at [facility] clinic.  It is unclear what biopsy sites specifically were positive.  Given this, doubtful this is FTD -He will continue carbidopa/levodopa 25/100, 2 tablets at 8 AM/noon/4 PM -Add carbidopa/levodopa 50/200 at bed for q hs cramping and first AM tremor -He had neurocognitive testing scheduled for October, 2024 but canceled that. -We discussed that it used to be thought that levodopa would increase risk of melanoma but now it is believed that Parkinsons itself likely increases risk of melanoma. he is to get regular skin checks. -discussed importance of safe, CV exercise -discussed on line voice therapy - speakout and [employer].  He does not wish to go to in person speech therapy 8, in part because he works full-time and in part because he lives in [place].  He is going to be more consistent with the online programs.   2.  Mood change             -Much improved on lexapro, 10 mg daily.  He will continue the medication.  Wife expresses that she does not think that they could "live without the medication."   3.  Lightheadedness/dizziness             -We did orthostatics when occurring and those were negative.             -Doubt related to #1             -will do CTA to r/o Vertebral a stenosis as he has a lot of the issues when looking up.  He is going back to [facility] tomorrow so we will need to hold on it until he returns Subjective:   Darren Sharp was seen today in follow up for Parkinsons disease.  My previous records were reviewed prior to todays visit as well as outside records available to me.  Pt with wife who supplements the history.    Patient has been in [place] and is doing well there.  He works at a [employer].  He enjoys that.  He has not had falls.  He is walking a lot at work.  He was just seen recently at his primary care office in [place].  Those notes are  reviewed.  He gets some lightheadedness when he looks up but otherwise no lightheadedness.  Voice is getting soft.  Tremor is worse before takes med in the AM and when gets in bed at night.    Current prescribed movement disorder medications: Escitalopram, 10 mg daily  Carbidopa/levodopa 25/100, 2 tablets at 8 AM/noon/4 PM   PREVIOUS MEDICATIONS: Depakote 125 mg (took it for a month and they stopped it as they felt patient was more aggressive with poor focus)  ALLERGIES:   Allergies  Allergen Reactions   Penicillins Anaphylaxis and Swelling    Penicillin injection when patient was a child. Experienced swelling of face, tongue, extremities. Required medical attention    CURRENT MEDICATIONS:  Current Meds  Medication Sig   Acetylcysteine (N-ACETYL-L-CYSTEINE PO) Take 1 capsule by mouth See admin instructions. Take 1 capsule (1000mg ) by mouth every morning and take 1 capsule (1000mg ) by mouth every night   ALPHA LIPOIC ACID PO Take by mouth.   aspirin 81 MG tablet Take 1 tablet (81 mg total) by mouth daily.   carbidopa-levodopa (SINEMET IR) 25-100 MG tablet Take 2 tablets by mouth 3 (three) times daily. 8am/noon/4pm   escitalopram (LEXAPRO)  10 MG tablet Take 1 tablet (10 mg total) by mouth daily.   hydrochlorothiazide (HYDRODIURIL) 12.5 MG tablet Take 1 tablet (12.5 mg total) by mouth daily. Hold for next 3 days.   Ipratropium-Albuterol (COMBIVENT RESPIMAT) 20-100 MCG/ACT AERS respimat Inhale 2 puffs into the lungs every 6 (six) hours as needed for wheezing or shortness of breath.   LORazepam (ATIVAN) 1 MG tablet Take 1 mg by mouth at bedtime as needed.   Multiple Vitamin (MULTIVITAMIN) tablet Take 1 tablet by mouth daily.   OVER THE COUNTER MEDICATION Take 1 capsule by mouth daily with lunch. **[employer] - ClearLungs Extra Strength**   rosuvastatin (CRESTOR) 10 MG tablet Take 10 mg by mouth every Monday, Wednesday, and Friday.   umeclidinium-vilanterol (ANORO ELLIPTA) 62.5-25  MCG/ACT AEPB Inhale 1 puff into the lungs daily.   vitamin E 180 MG (400 UNITS) capsule Take 400 Units by mouth daily.     Objective:   PHYSICAL EXAMINATION:    VITALS:   Vitals:   01/06/24 0746  BP: 132/64  Pulse: 80  SpO2: 96%  Weight: 156 lb 3.2 oz (70.9 kg)    Wt Readings from Last 3 Encounters:  01/06/24 156 lb 3.2 oz (70.9 kg)  04/08/23 160 lb (72.6 kg)  02/27/23 144 lb (65.3 kg)     GEN:  The patient appears stated age and is in NAD. HEENT:  Normocephalic, atraumatic.  The mucous membranes are moist. The superficial temporal arteries are without ropiness or tenderness. CV:  RRR Lungs:  CTAB Neck/HEME:  There are no carotid bruits bilaterally.   Neurological examination:   Orientation: The patient is alert and oriented x3.  Cranial nerves: There is good facial symmetry. Extraocular muscles are intact. The visual fields are full to confrontational testing. The speech is fluent and clear. Soft palate rises symmetrically and there is no tongue deviation. Hearing is intact to conversational tone. Sensation: Sensation is intact to light touch throughout. Motor: Strength is at least antigravity x 4.   Movement examination: Tone: There is normal tone today. Abnormal movements: there is no tremor today. Coordination:  There is no decremation with RAM's, with any form of RAMS, including alternating supination and pronation of the forearm, hand opening and closing, finger taps, heel taps and toe taps.  Gait and Station: The patient has no difficulty arising out of a deep-seated chair without the use of the hands. The patient's stride length is good today with good arm swing today.  I have reviewed and interpreted the following labs independently    Chemistry      Component Value Date/Time   NA 137 02/24/2022 0509   NA 143 02/04/2017 0922   K 3.5 02/24/2022 0509   CL 107 02/24/2022 0509   CO2 20 (L) 02/24/2022 0509   BUN 36 (H) 02/24/2022 0509   BUN 22 02/04/2017  0922   CREATININE 1.25 (H) 02/24/2022 0509      Component Value Date/Time   CALCIUM 8.8 (L) 02/24/2022 0509   ALKPHOS 59 02/21/2022 0942   AST 91 (H) 02/21/2022 0942   ALT 57 (H) 02/21/2022 0942   BILITOT 1.0 02/21/2022 0942   BILITOT 0.5 02/04/2017 0922       Lab Results  Component Value Date   WBC 4.8 02/23/2022   HGB 11.7 (L) 02/23/2022   HCT 34.2 (L) 02/23/2022   MCV 90.7 02/23/2022   PLT 151 02/23/2022    No results found for: "TSH"   Total time spent on today's visit  was 30 minutes, including both face-to-face time and nonface-to-face time.  Time included that spent on review of records (prior notes available to me/labs/imaging if pertinent), discussing treatment and goals, answering patient's questions and coordinating care.  Cc:  Danella Penton, MD

## 2024-01-06 ENCOUNTER — Encounter: Payer: Self-pay | Admitting: Neurology

## 2024-01-06 ENCOUNTER — Ambulatory Visit: Payer: PPO | Admitting: Neurology

## 2024-01-06 VITALS — BP 132/64 | HR 80 | Wt 156.2 lb

## 2024-01-06 DIAGNOSIS — G20A1 Parkinson's disease without dyskinesia, without mention of fluctuations: Secondary | ICD-10-CM

## 2024-01-06 DIAGNOSIS — F330 Major depressive disorder, recurrent, mild: Secondary | ICD-10-CM

## 2024-01-06 DIAGNOSIS — R42 Dizziness and giddiness: Secondary | ICD-10-CM | POA: Diagnosis not present

## 2024-01-06 DIAGNOSIS — H353211 Exudative age-related macular degeneration, right eye, with active choroidal neovascularization: Secondary | ICD-10-CM | POA: Diagnosis not present

## 2024-01-06 MED ORDER — CARBIDOPA-LEVODOPA 25-100 MG PO TABS: 2.0000 | ORAL_TABLET | Freq: Three times a day (TID) | ORAL | 1 refills | Status: DC

## 2024-01-06 MED ORDER — CARBIDOPA-LEVODOPA ER 50-200 MG PO TBCR: 1.0000 | EXTENDED_RELEASE_TABLET | Freq: Every day | ORAL | 1 refills | Status: DC

## 2024-01-06 MED ORDER — ESCITALOPRAM OXALATE 10 MG PO TABS: 10.0000 mg | ORAL_TABLET | Freq: Every day | ORAL | 2 refills | Status: DC

## 2024-01-06 NOTE — Patient Instructions (Addendum)
Take carbidopa/levodopa 25/100, 2 at 8am/noon/4pm ADD carbidopa/levodopa 50/200 CR at bed Continue lexapro Do the SPEAK OUT program online

## 2024-01-07 DIAGNOSIS — Z125 Encounter for screening for malignant neoplasm of prostate: Secondary | ICD-10-CM | POA: Diagnosis not present

## 2024-01-07 DIAGNOSIS — J431 Panlobular emphysema: Secondary | ICD-10-CM | POA: Diagnosis not present

## 2024-01-07 DIAGNOSIS — E782 Mixed hyperlipidemia: Secondary | ICD-10-CM | POA: Diagnosis not present

## 2024-01-07 DIAGNOSIS — F330 Major depressive disorder, recurrent, mild: Secondary | ICD-10-CM | POA: Diagnosis not present

## 2024-01-07 DIAGNOSIS — F339 Major depressive disorder, recurrent, unspecified: Secondary | ICD-10-CM | POA: Diagnosis not present

## 2024-01-07 DIAGNOSIS — E1169 Type 2 diabetes mellitus with other specified complication: Secondary | ICD-10-CM | POA: Diagnosis not present

## 2024-01-07 DIAGNOSIS — Z79899 Other long term (current) drug therapy: Secondary | ICD-10-CM | POA: Diagnosis not present

## 2024-01-07 DIAGNOSIS — G20B1 Parkinson's disease with dyskinesia, without mention of fluctuations: Secondary | ICD-10-CM | POA: Diagnosis not present

## 2024-05-25 ENCOUNTER — Other Ambulatory Visit: Payer: Self-pay | Admitting: Neurology

## 2024-07-15 NOTE — Progress Notes (Signed)
 Assessment/Plan:   1.   Parkinsonism, probable idiopathic pd -Biopsy for alpha-synuclein was positive at [facility] clinic.  It is unclear what biopsy sites specifically were positive.  Given this, doubtful this is FTD -He will continue carbidopa /levodopa  25/100, 2 tablets at 8 AM/noon/4 PM.  This was just renewed by primary care for 1 year. -Continue carbidopa /levodopa  50/200 at bed for q hs cramping and first AM tremor -they ask about crexont  but discussed he doesn't need it right now.  Well treated with levodopa . -He had neurocognitive testing scheduled for October, 2024 but canceled that.  They are ready to reschedule.  Wife with some concerns (expresses out of presence of pt) -We discussed that it used to be thought that levodopa  would increase risk of melanoma but now it is believed that Parkinsons itself likely increases risk of melanoma. he is to get regular skin checks. -discussed importance of safe, CV exercise   2.  Mood change             -Much improved on lexapro , 10 mg daily.  He will continue the medication.     3.  Lightheadedness/dizziness             -We did orthostatics when occurring and those were negative.             -Doubt related to #1             -CTA head/neck to rule out vertebral/basilar artery stenosis   Subjective:   Darren Sharp was seen today in follow up for Parkinsons disease.  My previous records were reviewed prior to todays visit as well as outside records available to me.  Pt with wife who supplements the history.   He is on immediate release levodopa  during the day and last visit we added extended release at bedtime for bedtime cramping and first morning tremor and he reports that those sx's resolved with the addition of that medication. last visit, patient was describing dizziness/lightheadedness, particularly when he would look up.  Orthostatics were negative.  We wanted to do a CTA to rule out vertebral artery stenosis, but he was going back to  [place]  the following day, where he was living most of the year, so we were not able to get that done before he left.  He does report today that he has moved back from [place] .  He still notes that he has that dizziness if wife asks him to look up at grocery store to get something from high shelf.  Wife notes memory change; pt notes that it is good, both short and long term memory.  B12 just done and it was >1500.  I personally reviewed and interpreted labs from pcp.    They ask about crexont .  Mood has been wonderful - a new person, per wife.  Current prescribed movement disorder medications: Escitalopram , 10 mg daily  Carbidopa /levodopa  25/100, 2 tablets at 8 AM/noon/4 PM Carbidopa /levodopa  50/200 CR at bedtime (started last visit)  PREVIOUS MEDICATIONS: Depakote 125 mg (took it for a month and they stopped it as they felt patient was more aggressive with poor focus)  ALLERGIES:   Allergies  Allergen Reactions   Penicillins Anaphylaxis and Swelling    Penicillin injection when patient was a child. Experienced swelling of face, tongue, extremities. Required medical attention    CURRENT MEDICATIONS:  Current Meds  Medication Sig   Acetylcysteine (N-ACETYL-L-CYSTEINE PO) Take 1 capsule by mouth See admin instructions. Take 1 capsule (1000mg ) by mouth  every morning and take 1 capsule (1000mg ) by mouth every night   ALPHA LIPOIC ACID PO Take by mouth.   aspirin  81 MG tablet Take 1 tablet (81 mg total) by mouth daily.   carbidopa -levodopa  (SINEMET  CR) 50-200 MG tablet TAKE 1 TABLET BY MOUTH EVERYDAY AT BEDTIME   carbidopa -levodopa  (SINEMET  IR) 25-100 MG tablet Take 2 tablets by mouth 3 (three) times daily. 8am/noon/4pm   escitalopram  (LEXAPRO ) 10 MG tablet Take 1 tablet (10 mg total) by mouth daily.   hydrochlorothiazide  (HYDRODIURIL ) 12.5 MG tablet Take 1 tablet (12.5 mg total) by mouth daily. Hold for next 3 days.   Ipratropium-Albuterol  (COMBIVENT RESPIMAT) 20-100 MCG/ACT AERS respimat  Inhale 2 puffs into the lungs every 6 (six) hours as needed for wheezing or shortness of breath.   LORazepam (ATIVAN) 1 MG tablet Take 1 mg by mouth at bedtime as needed.   Multiple Vitamin (MULTIVITAMIN) tablet Take 1 tablet by mouth daily.   OVER THE COUNTER MEDICATION Take 1 capsule by mouth daily with lunch. **[employer] - ClearLungs Extra Strength**   rosuvastatin  (CRESTOR ) 10 MG tablet Take 10 mg by mouth every Monday, Wednesday, and Friday.   umeclidinium-vilanterol (ANORO ELLIPTA ) 62.5-25 MCG/ACT AEPB Inhale 1 puff into the lungs daily.   vitamin E  180 MG (400 UNITS) capsule Take 400 Units by mouth daily.     Objective:   PHYSICAL EXAMINATION:    VITALS:   Vitals:   07/21/24 0759  BP: (!) 144/70  Pulse: 71  SpO2: 96%  Weight: 160 lb 9.6 oz (72.8 kg)     Wt Readings from Last 3 Encounters:  07/21/24 160 lb 9.6 oz (72.8 kg)  01/06/24 156 lb 3.2 oz (70.9 kg)  04/08/23 160 lb (72.6 kg)    GEN:  The patient appears stated age and is in NAD. HEENT:  Normocephalic, atraumatic.  The mucous membranes are moist. The superficial temporal arteries are without ropiness or tenderness. CV:  RRR Lungs:  CTAB Neck/HEME:  There are no carotid bruits bilaterally.   Neurological examination:   Orientation: The patient is alert and oriented x3.  Cranial nerves: There is good facial symmetry. Extraocular muscles are intact. The visual fields are full to confrontational testing. The speech is fluent and clear. Soft palate rises symmetrically and there is no tongue deviation. Hearing is intact to conversational tone. Sensation: Sensation is intact to light touch throughout. Motor: Strength is at least antigravity x 4.  Has not yet taken medication yet today (took it while I was in room)   Movement examination: Tone: There is very min increased tone in the LUE Abnormal movements: there is rare tremor in the LUE Coordination:  There is mild decremation with finger taps and toe  taps on the L Gait and Station: The patient has no difficulty arising out of a deep-seated chair without the use of the hands. The patient's stride length is good today with good arm swing today.  Total time spent on today's visit was 30 minutes, including both face-to-face time and nonface-to-face time.  Time included that spent on review of records (prior notes available to me/labs/imaging if pertinent), discussing treatment and goals, answering patient's questions and coordinating care.  Cc:  Cleotilde Oneil FALCON, MD

## 2024-07-21 ENCOUNTER — Ambulatory Visit: Payer: PPO | Admitting: Neurology

## 2024-07-21 ENCOUNTER — Encounter: Payer: Self-pay | Admitting: Neurology

## 2024-07-21 VITALS — BP 144/70 | HR 71 | Wt 160.6 lb

## 2024-07-21 DIAGNOSIS — R42 Dizziness and giddiness: Secondary | ICD-10-CM

## 2024-07-21 DIAGNOSIS — G20A1 Parkinson's disease without dyskinesia, without mention of fluctuations: Secondary | ICD-10-CM

## 2024-07-21 DIAGNOSIS — R413 Other amnesia: Secondary | ICD-10-CM

## 2024-07-21 MED ORDER — CARBIDOPA-LEVODOPA ER 50-200 MG PO TBCR: 1.0000 | EXTENDED_RELEASE_TABLET | Freq: Every day | ORAL | 1 refills | Status: DC

## 2024-07-21 MED ORDER — CARBIDOPA-LEVODOPA 25-100 MG PO TABS: 2.0000 | ORAL_TABLET | Freq: Three times a day (TID) | ORAL | 1 refills | Status: DC

## 2024-07-21 NOTE — Patient Instructions (Addendum)
 You have been referred for a neurocognitive evaluation (i.e., evaluation of memory and thinking abilities). Please bring someone with you to this appointment if possible, as it is helpful for the neuropsychologist to hear from both you and another adult who knows you well. Please bring eyeglasses and hearing aids if you wear them and take any medications as you normally would. Please fully abstain from all alcohol, marijuana, or other substances prior to your appointment.   The evaluation will take approximately 2-3 hours and has two parts:   The first part is a clinical interview with the neuropsychologist, Dr. Richie or Dr. Gayland. During the interview, the neuropsychologist will speak with you and the individual you brought to the appointment.    The second part of the evaluation is testing with the doctor's technician, aka psychometrician, Dana or [employer]. During the testing, the technician will ask you to remember different types of material, solve problems, and answer some questionnaires. Your family member will not be present for this portion of the evaluation.   Please note: We have to reserve several hours of the neuropsychologist's time and the psychometrician's time for your evaluation appointment. As such, there is a No-Show fee of $100. If you are unable to attend any of your appointments, please contact our office as soon as possible to reschedule.   Register Now!  We are planning a Parkinsons Disease educational symposium at [facility], [address] [place], [place], [place] [address] on September 19.  We will have a movement disorder physician expert from [employer] coming to speak and a caregiver speaker.  We will have a panel of experts that will show you who you may need on your team of people on your journey with Parkinsons.  I hope to see you there!  Use this QR code to register by scanning it with the camera app on your phone:      Need more help with registration?  Contact  [email or web address]

## 2024-07-21 NOTE — Addendum Note (Signed)
 Addended by: MERCER MITZIE BROCKS on: 07/21/2024 08:55 AM   Modules accepted: Orders

## 2024-07-24 ENCOUNTER — Ambulatory Visit
Admission: RE | Admit: 2024-07-24 | Discharge: 2024-07-24 | Disposition: A | Discharge status: Home / Self Care | Source: Ambulatory Visit | Attending: Neurology | Admitting: Neurology

## 2024-07-24 DIAGNOSIS — R42 Dizziness and giddiness: Secondary | ICD-10-CM | POA: Insufficient documentation

## 2024-07-24 MED ORDER — IOHEXOL 350 MG/ML SOLN
75.0000 mL | Freq: Once | INTRAVENOUS | Status: AC | PRN
  Administered 2024-07-24: 75 mL via INTRAVENOUS

## 2024-08-03 ENCOUNTER — Ambulatory Visit: Payer: Self-pay | Admitting: Neurology

## 2024-08-26 ENCOUNTER — Telehealth: Payer: Self-pay

## 2024-08-26 NOTE — Telephone Encounter (Signed)
 The wife ETTER Fees)  called in today and she stated that the MRI with contrast made her husband sick , 4 hours after the MRI was done. Fees also  stated that her husband  is feeling better now. Fees also stated that her husband is very depressed. Fees wanted you to know and she did not want this information put on mychart, cause she did not want her husband to read it. Thanks

## 2024-08-27 NOTE — Telephone Encounter (Signed)
 Called and spoke to patients wife and patient is doing better I have added a sensitivity to contrast media to his chart. Unfortunately patients wife did not want to discuss with him the depression and wanted to wait for the appointment to discuss changing meds or counseling

## 2024-08-28 ENCOUNTER — Ambulatory Visit
Admission: RE | Admit: 2024-08-28 | Discharge: 2024-08-28 | Disposition: A | Discharge status: Home / Self Care | Source: Ambulatory Visit | Attending: Internal Medicine | Admitting: Internal Medicine

## 2024-08-28 DIAGNOSIS — Z87891 Personal history of nicotine dependence: Secondary | ICD-10-CM | POA: Insufficient documentation

## 2024-08-28 DIAGNOSIS — Z122 Encounter for screening for malignant neoplasm of respiratory organs: Secondary | ICD-10-CM | POA: Insufficient documentation

## 2024-09-02 ENCOUNTER — Other Ambulatory Visit: Payer: Self-pay

## 2024-09-02 ENCOUNTER — Telehealth: Payer: Self-pay | Admitting: *Deleted

## 2024-09-02 DIAGNOSIS — R911 Solitary pulmonary nodule: Secondary | ICD-10-CM

## 2024-09-02 DIAGNOSIS — Z122 Encounter for screening for malignant neoplasm of respiratory organs: Secondary | ICD-10-CM

## 2024-09-02 DIAGNOSIS — R918 Other nonspecific abnormal finding of lung field: Secondary | ICD-10-CM

## 2024-09-02 DIAGNOSIS — Z87891 Personal history of nicotine dependence: Secondary | ICD-10-CM

## 2024-09-02 NOTE — Telephone Encounter (Signed)
 Spoke with patient. Pt has been sick off and on for several weeks, states he could never get rid of the illness completely. Saw PCP Monday 10/13 and has started antibiotics. Pt is in agreement to complete an 8 week follow up scan once antibiotics are completed. Scan will be due around 11/02/2024. Pt request call back closer to due date. Order placed, reminder set and results and plan to PCP.

## 2024-09-02 NOTE — Telephone Encounter (Signed)
 Call report from [facility]:  IMPRESSION: 1. Lung-RADS 0, incomplete. Additional lung cancer screening CT images/or comparison to prior chest CT examinations is needed. Multifocal reticulonodular opacities in the lingula, left lower lobe and right upper lobe, typical of infectious or inflammatory process. Recommend repeat low dose CT in 1-3 months. 2. Scattered new pulmonary nodules, many of which are associated with the reticulonodular process, and can be re-evaluated on short interval follow-up. 3. Coronary artery calcifications. 4. Aortic Atherosclerosis (ICD10-I70.0) and Emphysema (ICD10-J43.9).

## 2024-09-08 ENCOUNTER — Ambulatory Visit (INDEPENDENT_AMBULATORY_CARE_PROVIDER_SITE_OTHER): Admitting: Psychology

## 2024-09-08 ENCOUNTER — Ambulatory Visit: Payer: Self-pay

## 2024-09-08 DIAGNOSIS — R4189 Other symptoms and signs involving cognitive functions and awareness: Secondary | ICD-10-CM | POA: Diagnosis not present

## 2024-09-08 DIAGNOSIS — F4323 Adjustment disorder with mixed anxiety and depressed mood: Secondary | ICD-10-CM

## 2024-09-08 NOTE — Progress Notes (Unsigned)
 NEUROPSYCHOLOGICAL EVALUATION . [facility]  [place] Department of Neurology  Date of Evaluation: 09/08/2024  REASON FOR REFERRAL   Darren Sharp is a 78 year old, right-handed, White male with 14 years of formal education (no degree, normed at 13 years). He was referred for neuropsychological evaluation by his neurologist, Asberry Tat, D.O., to assess current neurocognitive functioning, document potential cognitive deficits, and assist with treatment planning in the setting of Parkinson's disease. This is his first neuropsychological evaluation.  SUMMARY OF RESULTS   Premorbid cognitive abilities are estimated to be in the average range based on word reading and sociodemographic factors. Relative to this baseline estimate, current performance was generally within age-related expectations, with few exceptions.  Specifically, processing speed was reduced on a visual scanning task, which may have been at least partially influenced by the presence of a prominent tremor during administration. In contrast, his speed on a counting task and an alternating attention task was adequate, although the latter was marked by a few shifting and sequencing errors. Immediate recall of a word list was also below expectations, while delayed recall and recognition were within normal limits. Other measures of learning/memory (i.e., short stories, visual shapes) were within expectations.  Performance on remaining tasks of attention/working memory, language, visuospatial abilities, and executive functioning (e.g., abstract reasoning, phonemic fluency, novel problem-solving) was intact.  On self-report questionnaires, he did not endorse clinically significant symptoms of depression, apathy, or anxiety.  DIAGNOSTIC IMPRESSION   Results of the current evaluation indicated broadly normal test performance, with the exception of minimal isolated weaknesses that did not appear to reflect broader  impairments within their respective domains. Although he receives assistance with a few activities of daily living, this support does not always appear to be due to an inability to function independently (see below for details). At this time, the degree of cognitive findings is not consistent with a diagnosable cognitive disorder. While some mild inefficiencies were noted, there is no clear pattern of impairment suggestive of Parkinson's-related cognitive decline. However, subtle contributions from his neurological condition cannot be entirely ruled out. Etiology of subjective cognitive concerns is likely multifactorial, including normal aging, mood symptoms (at least in part related to adjusting to the move to [place]  and leaving behind his life and friends), sleep disturbance, and reduced sensory functioning (i.e., hearing and vision loss).   Results serve as a baseline for future comparison, should it ever become necessary to re-evaluate the patient.  ICD-10 Codes: F43.23 Adjustment disorder with mixed anxiety and depressed mood; R41.89 Cognitive changes  RECOMMENDATIONS   In consultation with your doctor, schedule cognitive reevaluation on an as-needed basis to assess for cognitive decline and update treatment recommendations. Reevaluation should occur during a period of medical and affective stability.  It is recommended that his mood continue to be monitored, as he is currently adjusting to significant life changes, including a recent move to [place] and health-related challenges, and as emotional distress can exacerbate cognitive difficulties. If symptoms such as sadness, anxiety, or difficulty coping become more pronounced or harder to manage independently, he is strongly encouraged to seek support. He should discuss his current medication regimen with his prescribing provider to ensure you are receiving maximum benefit. Additional treatment options could include mindfulness, relaxation  techniques, or counseling.  Given ongoing sleep difficulties, he may benefit from the implementation of sleep hygiene techniques, including:  Go to bed and get up at the same time each day to help your body establish a regular rhythm. Establish and  maintain a bedtime routine. Certain activities such as stretching, meditating, listening to soft music, or reading ~15 minutes before bedtime can be a great way to regularly get your brain and body ready for sleep. Avoid taking naps during the day. Avoid alcohol and caffeine for 5 or 6 hours before going to bed. Get regular exercise, but not in the hours before bedtime. Use comfortable bedding and maintain a cool temperature in your bedroom. Block out light and distracting noise. Avoid watching television or using your phone/computer in bed. Avoid staying in bed if you have difficulty falling asleep. If you have not been able to get to sleep after about 20 minutes or more, get up and do something calming or boring until you feel sleepy, then return to bed and try again.  Prioritize physical health through diet, exercise, and sleep. Regular physical activity supports cardiovascular health, improves mood, and helps preserve mobility and independence. Aim for at least 150 minutes of moderate aerobic exercise per week (e.g., brisk walking, swimming, gardening). A brain-healthy diet such as the Mediterranean or MIND diet is rich in fruits, vegetables, whole grains, healthy fats, and lean proteins, and has been associated with reduced risk of cognitive decline. Additionally, getting adequate, quality sleep and managing chronic conditions with the help of healthcare providers are essential components of healthy aging.  Continue to stay socially and mentally engaged. Maintaining strong social connections and regularly stimulating your brain can help protect against cognitive decline. This includes staying connected with friends and family, volunteering, or  participating in community groups. Mentally engaging activities--such as reading, doing puzzles, playing strategy games, or learning a new language or musical instrument--promote brain plasticity. If you are interested in activities to support cognitive engagement, this site offers a variety of apps and games organized by difficulty level:  https://www.[facility]row[facility].org/get-to-know-barrow/centers-programs/neurorehabilitation-center/neuro-rehab-apps-and-games/  Consider implementing compensatory strategies to maximize independence and maintain daily functioning. Examples include:  Adhere to routine. Compensatory strategies work best when they are used consistently. Use a planner, calendar, or white board that has the schedule and important events for the day clearly listed to reference and cross off when tasks are complete.  Ask for written information, especially if it is new or unfamiliar (e.g., information provided at a doctor's appointment).  Create an organized environment. Keep items that can be easily misplaced in a sensible location and get into the habit of always returning the items to those places. Pay attention and reduce distractions. Make a point of focusing attention on information you want to remember. One-on-one interaction is more likely to facilitate attention and minimize distraction. Make eye contact and repeat the information out loud after you hear it. Reduce interruptions or distractions especially when attempting to learn new information.  Create associations. When learning something new, think about and understand the information. Explain it in your own words or try to associate it with something you already know. Take notes to help remember important details. Evaluate goals and plan accordingly. When confronted by many different tasks, begin by making a list that prioritizes each task and estimates the time it will take to complete. Break down complicated tasks into smaller, more  manageable steps. Focus on one task at a time and complete each task before starting another. Avoid multitasking.  DISPOSITION   Patient will follow up with the referring provider, Dr. Evonnie. No follow-up neuropsychological testing was scheduled at this time. Please feel free to refer the patient for repeated evaluation if he shows a significant change in neurocognitive status. He  and his wife will be provided verbal feedback in approximately one week regarding the findings and impression during this visit.  The remainder of the report includes the details of the patient's background and a table of results from the current evaluation, which support the summary and recommendations described above.  BACKGROUND   History of Presenting Illness: The following information was obtained from a review of medical records and an interview with the patient and his wife, Sherrilyn. Briefly, the patient has been under the care of Dr. Evonnie at [facility] since December 2023 for the management of Parkinson's disease. At that time, he was reported to have difficulty recalling names and with multitasking, and it was also noted that he was exhibiting mood changes such as impatience, aggression, and depression. Consequently, he was referred for a neuropsychological evaluation. However, he did not complete the evaluation. During recent neurology appointments, his wife reported ongoing cognitive concerns, and he was again encouraged to proceed with the neuropsychological assessment. Of note, the patient reported that in September, after undergoing a CTA with contrast, he developed symptoms that he believes may have been related to a contrast reaction, such as bronchial spasms with coughing and nosebleeds. He did not seek medical evaluation until six weeks later, due to symptoms persisting. He was prescribed a week of antibiotics, which he completed yesterday, and his symptoms have since improved.  Cognitive Functioning:  During today's appointment, both the patient and his wife denied all cognitive concerns, including short-term memory, attention, processing speed, word finding, navigation, and executive functioning (e.g., planning, organizing, problem solving).  Physical Functioning: Patient recently began experiencing difficulty initiating sleep, which causes him to fall asleep later than usual and consequently sleep longer. He uses Ativan to help with sleep. He denied any history of sleep apnea or dream enactment behaviors. Appetite remains stable, with no changes in his sense of smell or taste. He denied dysphagia but reported changes in speech (i.e., "comes and goes"). He reported vision loss in his right eye due to macular degeneration and a loose lens. Medical records suggest he may have had a stroke behind his right eye, although he does not recall this event. 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 For a period, his vision in both eyes was poor, but currently, his left eye is stable. He has hearing loss and uses bilateral hearing aids. He denied balance problems and falls; however, when prompted by his wife, he acknowledged that sometimes he turns his body before his foot, causing mild stumbling. He also reported a tremor in his right hand.  Emotional Functioning: Patient described his recent mood as "good." He denied experiencing depression, apathy, or anxiety, and he also denied any suicidal ideation. His wife reported that he has "been awesome" lately, noting that while he previously exhibited notable anxiety and aggression, these symptoms have significantly improved. However, they did mention that it has been difficult for him to adjust since moving to [place] , as he deeply misses his friends and his former lifestyle.  Neuroimaging: CTA of the head (07/24/2024) was unremarkable. MRI of the brain (0/03/2022) documented mild chronic  small-vessel ischemic changes of the pons and cerebral hemispheric white matter, tiny old left frontal cortical infarction, and mild age-related volume loss.  Other Relevant Medical History: Remarkable for hypertension, peripheral artery disease, aortic atherosclerosis, type 2 diabetes, hyperlipidemia, chronic obstructive pulmonary disease, and panlobular emphysema. Please refer to the medical record for a more  comprehensive problem list. No history of stroke, CNS infection, head injury, or seizure was reported.  Current Medications: Per patient's wife, acetylcysteine, alpha lipoic acid, aspirin , carbidopa -levodopa , ClearLungs, escitalopram , ipratropium-albuterol , lisinopril , lorazepam, rosuvastatin , umeclidinium-vilanterol, and vitamin E .   Functional Status: Patient continues to hold a valid driver's license, although he noted that his wife does the majority of the driving. His wife reported that he does not drive at all, but he clarified that he does drive occasionally, albeit very rarely and typically only for short local trips. Patient and his wife co-manage their finances. He handles his responsibilities well and noted that major bills are set to be paid automatically. He otherwise manually keeps track of other matters (e.g., yearly renewals, vehicle tags, etc.). His wife manages his medications and believes he would not be able to do so independently. He explained that he has never been responsible for his medications, as his wife, a trained nurse, has handled them from the beginning. However, he does set phone alarms to remind himself to take his medications. He did not report any significant difficulties operating household appliances and remains independent with all basic activities of daily living.  Family Neurological History: Unremarkable.  Psychiatric History: Remarkable for the recent onset of mood symptoms associated with adjustment difficulties following his move to [place] . He  otherwise denied a history of depression, anxiety, prior mental health treatment, suicidal ideation, hallucinations, and psychiatric hospitalizations.  Substance Use History: Patient denied current use of alcohol, nicotine, marijuana, and illicit substances.  Social and Developmental History: Patient was born in [place], [place]. History of perinatal complications and developmental delays was not reported. He has been married for 13 years, previously divorced, and resides with his wife. He does not have children.  Educational and Occupational History: No history of childhood learning disability, special education services, or grade retention was reported. 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 He completed high school on time and then attended two years of college in [place] but did not earn a degree; college instruction was in [place]. 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  BEHAVIORAL OBSERVATIONS   Patient arrived on time and was accompanied by his wife, Sherrilyn. He ambulated independently and without gait disturbance. He demonstrated decreased arm swing on the left side. He was alert and fully oriented. He was appropriately groomed and dressed for the setting. A tremor was observed in his right hand, most evident on written tasks during testing. Vision (with glasses) and hearing (with bilateral hearing aids) were adequate for testing purposes despite known vision and hearing impairment. Speech was moderately hypophonic. No conversational word-finding difficulties, paraphasic errors, or dysarthria were observed. Comprehension was conversationally intact. Thought processes were linear, logical, and coherent. Thought content was organized and devoid of delusions. Insight appeared appropriate. Affect was even and congruent with mood. He was cooperative and gave adequate effort during testing, including on standalone and embedded measures of performance  validity. Results are thought to accurately reflect his cognitive functioning at this time.  NEUROPSYCHOLOGICAL TESTING RESULTS   Tests Administered: Animal Naming Test; Controlled Oral Word Association Test (COWAT): FAS; Geriatric Anxiety Scale-10 Item (GAS-10); Geriatric Depression Scale Short Form (GDS-SF); Hopkins Verbal Learning Test-Revised (HVLT-R) - From 1; Neuropsychological Assessment Battery (NAB) Form 1 - Subtest(s): Naming, Visual Discrimination, Shape Learning; Standalone performance validity test (PVT); Starkstein Apathy Scale (SAS); Test of Premorbid Functioning (TOPF); Trail Making Test (TMT); Wechsler Adult Intelligence Scale Fifth Edition (WAIS-5) - Subtest(s): Similarities, Clinical cytogeneticist, Matrix  Reasoning, Digit Sequencing, Coding, Running Digits, Symbol Search, Symbol Span; Wechsler Memory Scale Fourth Edition (WMS-IV) - Subtest(s): Logical Memory (LM); and [place]  Card Sorting Test 64 Card Version (WCST-64).  Test results are provided in the table below. Whenever possible, the patient's scores were compared against age-, sex-, and education-corrected normative samples. Interpretive descriptions are based on the AACN consensus conference statement on uniform labeling (Guilmette et al., 2020).  PREMORBID FUNCTIONING RAW  RANGE  TOPF 44 StdS=102 Average  ATTENTION & WORKING MEMORY RAW  RANGE  WAIS-5 Digit Sequencing -- ss=10 Average  WAIS-5 Running Digits -- ss=13 High Average  WAIS-5 Symbol Span -- ss=13 High Average  PROCESSING SPEED RAW  RANGE  Trails A 70''0e T=32 Below Average  WAIS-5 Naming Speed Quantity -- ss=11 Average  EXECUTIVE FUNCTION RAW  RANGE  Trails B 157''3e T=37 Low Average  WAIS-5 Matrix Reasoning -- ss=6 Low Average  WAIS-5 Similarities -- ss=10 Average  COWAT Letter Fluency 12+10+11 T=44 Average  WCST-64 Total Errors 17 T=60 High Average  WCST-64 Perseverative Errors 10 T=59 High Average  WCST-64 Nonperseverative Errors 7 T=54 Average  WCST-64  Categories Completed 2 >16%ile WNL  WCSR-64 FMS 2 -- --  LANGUAGE RAW  RANGE  COWAT Letter Fluency 12+10+11 T=44 Average  Animal Naming Test 15 T=46 Average  NAB Naming Test 31/31 T=60 WNL  VISUOSPATIAL RAW  RANGE  WAIS-5 Block Design -- ss=12 High Average  NAB Visual Discrimination 16/18 T=54 Average  VERBAL LEARNING & MEMORY RAW  RANGE  HVLT-R Learning Trials (3+5+7)/36 T=34 Below Average  HVLT-R Delayed Recall 7/12 T=45 Average  HVLT-R Recognition Hits 11 -- --  HVLT-R Recognition False Positives 1 -- --  HVLT-R Discrimination Index 10 T=48 Average  WMS-IV LM-I  (11+13+7)/53 ss=10 Average  WMS-IV LM-II  (9+7)/39 ss=10 Average  WMS-IV LM Recognition  (6+9)/23 17-25%ile Low Average  VISUAL LEARNING & MEMORY RAW  RANGE  NAB Shape Learning Immediate Recognition (5+5+4)/27 T=51 Average  NAB Shape Learning Delayed Recognition 3/9 T=39 Low Average  NAB Shape Learning Delayed RDI 7h, 4fa T=49 Average  QUESTIONNAIRES RAW  RANGE  GDS-SF 0 -- Minimal  GAS-10 2 -- Minimal  SAS 4 -- WNL  *Note: ss = scaled score; StdS = standard score; T = t-score; C/S = corrected raw score; WNL = within normal limits; BNL= below normal limits; D/C = discontinued. Scores from skewed distributions are typically interpreted as WNL (>=16th %ile) or BNL (<16th %ile).   INFORMED CONSENT   Patient was provided with a verbal description of the nature and purpose of the neuropsychological evaluation. Also reviewed were the foreseeable risks and/or discomforts and benefits of the procedure, limits of confidentiality, and mandatory reporting requirements of this provider. Patient was given the opportunity to have their questions answered. Oral consent to participate was provided by the patient.   This report was prepared as part of a clinical evaluation and is not intended for forensic use.  SERVICE   This evaluation was conducted by Renda Beckwith, Psy.D. In addition to time spent directly with the patient, total  professional time (180 minutes) includes record review, integration of relevant medical history, test selection, interpretation of findings, and report preparation. A technician, Evalene Pizza, B.S., provided testing and scoring assistance (181 minutes).  Psychiatric Diagnostic Evaluation Services (Professional): 09208 x 1 Neuropsychological Testing Evaluation Services (Professional): [phone] x 1 Neuropsychological Testing Evaluation Services (Professional): 03866 x 2 Neuropsychological Test Administration and Scoring Radiographer, therapeutic): [phone] x 1 Neuropsychological Test Administration and Scoring (Technician): [phone] x  5  This report was generated using voice recognition software. While this document has been carefully reviewed, transcription errors may be present. I apologize in advance for any inconvenience. Please contact me if further clarification is needed.            Renda Beckwith, Psy.D.             Neuropsychologist

## 2024-09-08 NOTE — Progress Notes (Signed)
   Psychometrician Note   Cognitive testing was administered to Darren Sharp by Evalene Pizza, B.S. (psychometrist) under the supervision of Dr. Renda Beckwith, Psy.D., licensed psychologist on 09/08/2024. [name] did not appear overtly distressed by the testing session per behavioral observation or responses across self-report questionnaires. Rest breaks were offered.   The battery of tests administered was selected by Dr. Renda Beckwith, Psy.D. with consideration to [name] current level of functioning, the nature of his symptoms, emotional and behavioral responses during interview, level of literacy, observed level of motivation/effort, and the nature of the referral question. This battery was communicated to the psychometrist. Communication between Dr. Renda Beckwith, Psy.D. and the psychometrist was ongoing throughout the evaluation and Dr. Renda Beckwith, Psy.D. was immediately accessible at all times. Dr. Renda Beckwith, Psy.D. provided supervision to the psychometrist on the date of this service to the extent necessary to assure the quality of all services provided.    Darren Sharp will return within approximately 1-2 weeks for an interactive feedback session with Dr. Beckwith at which time his test performances, clinical impressions, and treatment recommendations will be reviewed in detail. [name] understands he can contact our office should he require our assistance before this time.  A total of 181 minutes of billable time were spent face-to-face with [name] by the psychometrist. This includes both test administration and scoring time. Billing for these services is reflected in the clinical report generated by Dr. Renda Beckwith, Psy.D.  This note reflects time spent with the psychometrician and does not include test scores or any clinical interpretations made by Dr. Beckwith. The full report will follow in a separate note.

## 2024-09-10 ENCOUNTER — Encounter: Payer: Self-pay | Admitting: Acute Care

## 2024-09-15 ENCOUNTER — Ambulatory Visit: Admitting: Psychology

## 2024-09-15 DIAGNOSIS — F4323 Adjustment disorder with mixed anxiety and depressed mood: Secondary | ICD-10-CM

## 2024-09-15 DIAGNOSIS — R4189 Other symptoms and signs involving cognitive functions and awareness: Secondary | ICD-10-CM

## 2024-09-15 NOTE — Progress Notes (Signed)
   NEUROPSYCHOLOGY FEEDBACK SESSION [place]  [place] Department of Neurology  Date of Feedback Session: 09/15/2024  REASON FOR REFERRAL   Darren Sharp is a 78 year old, right-handed, White male with 14 years of formal education (no degree, normed at 13 years). He was referred for neuropsychological evaluation by his neurologist, Asberry Tat, D.O., to assess current neurocognitive functioning, document potential cognitive deficits, and assist with treatment planning in the setting of Parkinson's disease. This is his first neuropsychological evaluation.  FEEDBACK   Patient completed a comprehensive neuropsychological evaluation on 09/08/2024. Please refer to that encounter for the full report and recommendations. Briefly, results  indicated broadly normal test performance, with the exception of minimal isolated weaknesses that did not appear to reflect broader impairments within their respective domains. Although he receives assistance with a few activities of daily living, this support does not always appear to be due to an inability to function independently (see below for details). At this time, the degree of cognitive findings is not consistent with a diagnosable cognitive disorder. While some mild inefficiencies were noted, there is no clear pattern of impairment suggestive of Parkinson's-related cognitive decline. However, subtle contributions from his neurological condition cannot be entirely ruled out. Etiology of subjective cognitive concerns is likely multifactorial, including normal aging, mood symptoms (at least in part related to adjusting to the move to [place]  and leaving behind his life and friends), sleep disturbance, and reduced sensory functioning (i.e., hearing and vision loss).   Today, the patient was accompanied by his wife. They were provided verbal feedback regarding the findings and impression during this visit, and their questions were answered. A  copy of the report was provided at the conclusion of the visit.  DISPOSITION   No follow-up neuropsychological testing was scheduled at this time. Please feel free to refer the patient for repeated evaluation if he shows a significant change in neurocognitive status.  SERVICE   This feedback session was conducted by Renda Beckwith, Psy.D. [place] of 03867 (35 minutes) was billed for Dr. Beckwith' time spent in preparing, conducting, and documenting the current feedback session.  This report was generated using voice recognition software. While this document has been carefully reviewed, transcription errors may be present. I apologize in advance for any inconvenience. Please contact me if further clarification is needed.

## 2024-09-22 ENCOUNTER — Encounter: Admitting: Psychology

## 2024-10-01 ENCOUNTER — Telehealth: Payer: Self-pay | Admitting: *Deleted

## 2024-10-01 ENCOUNTER — Other Ambulatory Visit: Payer: Self-pay | Admitting: Physician Assistant

## 2024-10-01 DIAGNOSIS — R042 Hemoptysis: Secondary | ICD-10-CM

## 2024-10-01 DIAGNOSIS — R053 Chronic cough: Secondary | ICD-10-CM

## 2024-10-01 DIAGNOSIS — R61 Generalized hyperhidrosis: Secondary | ICD-10-CM

## 2024-10-01 NOTE — Telephone Encounter (Signed)
 Spoke with patient. He saw his PCP 09/30/24 and she recommended pt go ahead and repeat his Chest CT due to symptoms pt is currently having. Pt finished antibiotic 3 weeks ago.. CT chest LCS Nodule follow up CT scheduled 10/05/24.

## 2024-10-05 ENCOUNTER — Ambulatory Visit
Admission: RE | Admit: 2024-10-05 | Discharge: 2024-10-05 | Disposition: A | Discharge status: Home / Self Care | Source: Ambulatory Visit | Attending: Acute Care | Admitting: Acute Care

## 2024-10-05 DIAGNOSIS — R911 Solitary pulmonary nodule: Secondary | ICD-10-CM | POA: Diagnosis present

## 2024-10-05 DIAGNOSIS — Z122 Encounter for screening for malignant neoplasm of respiratory organs: Secondary | ICD-10-CM | POA: Diagnosis present

## 2024-10-05 DIAGNOSIS — Z87891 Personal history of nicotine dependence: Secondary | ICD-10-CM | POA: Diagnosis present

## 2024-10-13 ENCOUNTER — Telehealth: Payer: Self-pay | Admitting: *Deleted

## 2024-10-13 NOTE — Telephone Encounter (Signed)
 Call report from [facility]:  IMPRESSION: 1. Lung-RADS 2, benign appearance or behavior. Continue annual screening with low-dose chest CT without contrast in 12 months. Interval resolution and decrease in size of previously noted nodules, likely infectious/inflammatory. 2. Partially imaged hyperdensity within the parapelvic interpolar right kidney, which may represent a hemorrhagic/proteinaceous cyst. Consider further evaluation with renal protocol MRI abdomen or CT abdomen. 3. Small focus of cutaneous soft tissue thickening overlying the inferior sternum would be amenable to direct inspection. 4. Aortic Atherosclerosis (ICD10-I70.0) and Emphysema (ICD10-J43.9). Coronary artery calcifications. Assessment for potential risk factor modification, dietary therapy or pharmacologic therapy may be warranted, if clinically indicated.

## 2024-10-19 ENCOUNTER — Other Ambulatory Visit: Payer: Self-pay | Admitting: Internal Medicine

## 2024-10-19 DIAGNOSIS — N2889 Other specified disorders of kidney and ureter: Secondary | ICD-10-CM

## 2024-10-20 ENCOUNTER — Ambulatory Visit
Admission: RE | Admit: 2024-10-20 | Discharge: 2024-10-20 | Disposition: A | Source: Ambulatory Visit | Attending: Internal Medicine | Admitting: Internal Medicine

## 2024-10-20 ENCOUNTER — Other Ambulatory Visit

## 2024-10-20 DIAGNOSIS — N2889 Other specified disorders of kidney and ureter: Secondary | ICD-10-CM | POA: Diagnosis present

## 2024-10-20 MED ORDER — GADOBUTROL 1 MMOL/ML IV SOLN
7.0000 mL | Freq: Once | INTRAVENOUS | Status: AC | PRN
  Administered 2024-10-20: 7 mL via INTRAVENOUS

## 2024-10-21 ENCOUNTER — Other Ambulatory Visit: Payer: Self-pay

## 2024-10-21 DIAGNOSIS — Z122 Encounter for screening for malignant neoplasm of respiratory organs: Secondary | ICD-10-CM

## 2024-10-21 DIAGNOSIS — Z87891 Personal history of nicotine dependence: Secondary | ICD-10-CM

## 2024-10-21 DIAGNOSIS — R911 Solitary pulmonary nodule: Secondary | ICD-10-CM

## 2024-10-21 NOTE — Telephone Encounter (Signed)
 Results reviewed by [place] NP. Please call the patient and review recent Lung CT results. He will need a 6 month follow up scan to evaluate stability of the 10.3 mm nodule with a 6.4 solid component. He has already a had an MRI Abdomen with his PCP to address the renal concern. Send results and plan to PCP.

## 2024-10-21 NOTE — Telephone Encounter (Signed)
 Spoke with patient and reviewed recent Lung CT results. Advises he PCP called him earlier and reviewed the results with him as well. He completed his renal MRI yesterday. He will complete a 6 month follow up Lung CT scheduled for 04/05/2025. Order placed. Results and plan to PCP.

## 2024-12-08 NOTE — Progress Notes (Unsigned)
 "   Assessment/Plan:   1.   Parkinsonism, probable idiopathic pd -Biopsy for alpha-synuclein was positive at [facility] clinic.  It is unclear what biopsy sites specifically were positive.  Given this, doubtful this is FTD -He will continue carbidopa /levodopa  25/100, 2 tablets at 8 AM/noon/4 PM.  -Continue carbidopa /levodopa  50/200 at bed for q hs cramping and first AM tremor -We discussed that it used to be thought that levodopa  would increase risk of melanoma but now it is believed that Parkinsons itself likely increases risk of melanoma. he is to get regular skin checks. -discussed importance of safe, CV exercise   2.  Mood change             - Escitalopram , 10 mg daily did help   3.  Lightheadedness/dizziness             -We did orthostatics when occurring and those were negative.             -Doubt related to #1             -CTA head/neck unrevealing for source.  There was proximal left ICA stenosis, 50%, but this would not be a source. 4.  Memory change  - Neurocognitive testing done in October, 2025 was completely normal.  Suspect that issues that patient/wife are seeing/experiencing really related to stressors and moving back to [place]  and leaving lifelong friends.  This will need to be something that is further discussed outside of the neurologic realm, perhaps with primary care, counseling and/or psychiatry.  Subjective:   Darren Sharp was seen today in follow up for Parkinsons disease.  My previous records were reviewed prior to todays visit as well as outside records available to me.  Pt with wife who supplements the history.   He is on immediate release levodopa  during the day and extended release levodopa  at bedtime.  Last visit, wife and patient were both describing mood and memory changes.  Patient had neurocognitive testing done since last visit and testing was normal.  Not exercising right now much.  He is playing games on the phone to keep mind active.    Current  prescribed movement disorder medications: Escitalopram , 10 mg daily  Carbidopa /levodopa  25/100, 2 tablets at 8 AM/noon/4 PM Carbidopa /levodopa  50/200 CR at bedtime   PREVIOUS MEDICATIONS: Depakote 125 mg (took it for a month and they stopped it as they felt patient was more aggressive with poor focus)  ALLERGIES:   Allergies  Allergen Reactions   Penicillins Anaphylaxis and Swelling    Penicillin injection when patient was a child. Experienced swelling of face, tongue, extremities. Required medical attention   Iodinated Contrast Media Nausea Only    Chills , nose bleed , cough    CURRENT MEDICATIONS:  Current Meds  Medication Sig   Acetylcysteine (N-ACETYL-L-CYSTEINE PO) Take 1 capsule by mouth See admin instructions. Take 1 capsule (1000mg ) by mouth every morning and take 1 capsule (1000mg ) by mouth every night   ALPHA LIPOIC ACID PO Take by mouth.   aspirin  81 MG tablet Take 1 tablet (81 mg total) by mouth daily.   Ipratropium-Albuterol  (COMBIVENT RESPIMAT) 20-100 MCG/ACT AERS respimat Inhale 2 puffs into the lungs every 6 (six) hours as needed for wheezing or shortness of breath.   lisinopril  (ZESTRIL ) 5 MG tablet Take 5 mg by mouth daily.   LORazepam (ATIVAN) 1 MG tablet Take 1 mg by mouth at bedtime as needed.   OVER THE COUNTER MEDICATION Take 1 capsule by mouth  daily with lunch. **[employer] - ClearLungs Extra Strength**   rosuvastatin  (CRESTOR ) 10 MG tablet Take 10 mg by mouth every Monday, Wednesday, and Friday.   umeclidinium-vilanterol (ANORO ELLIPTA ) 62.5-25 MCG/ACT AEPB Inhale 1 puff into the lungs daily.   vitamin E  180 MG (400 UNITS) capsule Take 400 Units by mouth daily. (Patient taking differently: Take 400 Units by mouth every other day.)   [DISCONTINUED] carbidopa -levodopa  (SINEMET  CR) 50-200 MG tablet Take 1 tablet by mouth at bedtime.   [DISCONTINUED] carbidopa -levodopa  (SINEMET  IR) 25-100 MG tablet Take 2 tablets by mouth 3 (three) times daily. 8am/noon/4pm    [DISCONTINUED] escitalopram  (LEXAPRO ) 10 MG tablet Take 1 tablet (10 mg total) by mouth daily.     Objective:   PHYSICAL EXAMINATION:    VITALS:   Vitals:   12/10/24 1307  BP: 124/72  Pulse: 66  SpO2: 97%  Weight: 166 lb 11.2 oz (75.6 kg)      Wt Readings from Last 3 Encounters:  12/10/24 166 lb 11.2 oz (75.6 kg)  07/21/24 160 lb 9.6 oz (72.8 kg)  01/06/24 156 lb 3.2 oz (70.9 kg)    GEN:  The patient appears stated age and is in NAD. HEENT:  Normocephalic, atraumatic.  The mucous membranes are moist. The superficial temporal arteries are without ropiness or tenderness. CV:  RRR Lungs:  CTAB Neck/HEME:  There are no carotid bruits bilaterally.   Neurological examination:   Orientation: The patient is alert and oriented x3.  Cranial nerves: There is good facial symmetry. Extraocular muscles are intact. The visual fields are full to confrontational testing. The speech is fluent and clear. Soft palate rises symmetrically and there is no tongue deviation. Hearing is intact to conversational tone. Sensation: Sensation is intact to light touch throughout. Motor: Strength is at least antigravity x 4.   Movement examination: Tone: There is normal tone in the upper and lower extremities today. Abnormal movements: there is no tremor today Coordination:  There is mild decremation with finger taps and toe taps on the L Gait and Station: The patient has no difficulty arising out of a deep-seated chair without the use of the hands. The patient's stride length is good today with good arm swing today.  Total time spent on today's visit was 30 minutes, including both face-to-face time and nonface-to-face time.  Time included that spent on review of records (prior notes available to me/labs/imaging if pertinent), discussing treatment and goals, answering patient's questions and coordinating care.  Cc:  Cleotilde Oneil FALCON, MD  "

## 2024-12-10 ENCOUNTER — Ambulatory Visit: Admitting: Neurology

## 2024-12-10 ENCOUNTER — Encounter: Payer: Self-pay | Admitting: Neurology

## 2024-12-10 VITALS — BP 124/72 | HR 66 | Wt 166.7 lb

## 2024-12-10 DIAGNOSIS — G20A1 Parkinson's disease without dyskinesia, without mention of fluctuations: Secondary | ICD-10-CM

## 2024-12-10 DIAGNOSIS — F330 Major depressive disorder, recurrent, mild: Secondary | ICD-10-CM

## 2024-12-10 DIAGNOSIS — R413 Other amnesia: Secondary | ICD-10-CM | POA: Diagnosis not present

## 2024-12-10 MED ORDER — CARBIDOPA-LEVODOPA 25-100 MG PO TABS: 2.0000 | ORAL_TABLET | Freq: Three times a day (TID) | ORAL | 1 refills | Status: AC

## 2024-12-10 MED ORDER — ESCITALOPRAM OXALATE 10 MG PO TABS: 10.0000 mg | ORAL_TABLET | Freq: Every day | ORAL | 1 refills | Status: AC

## 2024-12-10 MED ORDER — CARBIDOPA-LEVODOPA ER 50-200 MG PO TBCR: 1.0000 | EXTENDED_RELEASE_TABLET | Freq: Every day | ORAL | 1 refills | Status: AC

## 2024-12-10 NOTE — Patient Instructions (Signed)
" °  VISIT SUMMARY: Darren Sharp, you had a follow-up appointment today to discuss your Parkinson's disease, depression, and overall health. We reviewed your current medications, physical activity, and cognitive exercises. You reported no new or worsening symptoms, but you are experiencing fatigue and feeling overwhelmed by responsibilities.  YOUR PLAN: -PARKINSON'S DISEASE: Parkinson's disease is a progressive nervous system disorder that affects movement. Your condition is well-managed with carbidopa -levodopa , and there are no signs of neurodegenerative memory changes. We refilled your carbidopa -levodopa  prescriptions and encouraged you to engage in 150 minutes of cardiovascular exercise weekly, including resistance training, yoga, or tai chi. We also reinforced the importance of cognitive stimulation using the Mod[employer] app and advised you to continue annual dermatology visits for melanoma surveillance.  INSTRUCTIONS: Please continue taking your medications as prescribed and aim to increase your physical activity to 150 minutes of cardiovascular exercise weekly. Use the Modulon app regularly for cognitive exercises. Schedule your annual dermatology visit for melanoma surveillance. If you have any interim needs or concerns before your next visit, feel free to reach out.    Contains text generated by [employer].   "

## 2025-01-21 ENCOUNTER — Ambulatory Visit: Admitting: Neurology

## 2025-04-05 ENCOUNTER — Ambulatory Visit

## 2025-06-15 ENCOUNTER — Ambulatory Visit: Payer: Self-pay | Admitting: Neurology
